# Patient Record
Sex: Male | Born: 1950 | Race: White | Hispanic: No | Marital: Married | Smoking: Former smoker
Health system: Southern US, Community
[De-identification: ages and names within clinical notes are randomized; demographics above are authoritative.]

## PROBLEM LIST (undated history)

## (undated) DIAGNOSIS — Z9889 Other specified postprocedural states: Secondary | ICD-10-CM

## (undated) DIAGNOSIS — E7800 Pure hypercholesterolemia, unspecified: Secondary | ICD-10-CM

## (undated) DIAGNOSIS — K632 Fistula of intestine: Secondary | ICD-10-CM

## (undated) DIAGNOSIS — R011 Cardiac murmur, unspecified: Secondary | ICD-10-CM

## (undated) DIAGNOSIS — K859 Acute pancreatitis without necrosis or infection, unspecified: Secondary | ICD-10-CM

## (undated) DIAGNOSIS — I519 Heart disease, unspecified: Secondary | ICD-10-CM

## (undated) DIAGNOSIS — I10 Essential (primary) hypertension: Secondary | ICD-10-CM

## (undated) DIAGNOSIS — I2510 Atherosclerotic heart disease of native coronary artery without angina pectoris: Secondary | ICD-10-CM

## (undated) DIAGNOSIS — M549 Dorsalgia, unspecified: Secondary | ICD-10-CM

## (undated) DIAGNOSIS — G8929 Other chronic pain: Secondary | ICD-10-CM

## (undated) DIAGNOSIS — F419 Anxiety disorder, unspecified: Secondary | ICD-10-CM

## (undated) DIAGNOSIS — R0602 Shortness of breath: Secondary | ICD-10-CM

## (undated) DIAGNOSIS — K861 Other chronic pancreatitis: Secondary | ICD-10-CM

## (undated) HISTORY — DX: Other specified postprocedural states: Z98.890

## (undated) HISTORY — PX: CHOLECYSTECTOMY: SHX55

## (undated) HISTORY — DX: Essential (primary) hypertension: I10

## (undated) HISTORY — DX: Other chronic pancreatitis: K86.1

## (undated) HISTORY — DX: Anxiety disorder, unspecified: F41.9

## (undated) HISTORY — DX: Pure hypercholesterolemia, unspecified: E78.00

## (undated) HISTORY — PX: TRACHEOSTOMY: SUR1362

## (undated) HISTORY — DX: Cardiac murmur, unspecified: R01.1

## (undated) HISTORY — PX: CHEST TUBE INSERTION: SHX231

## (undated) HISTORY — DX: Other chronic pain: G89.29

## (undated) HISTORY — DX: Fistula of intestine: K63.2

## (undated) HISTORY — DX: Acute pancreatitis without necrosis or infection, unspecified: K85.90

## (undated) HISTORY — DX: Atherosclerotic heart disease of native coronary artery without angina pectoris: I25.10

## (undated) HISTORY — PX: OTHER SURGICAL HISTORY: SHX169

## (undated) HISTORY — DX: Dorsalgia, unspecified: M54.9

## (undated) HISTORY — DX: Heart disease, unspecified: I51.9

---

## 1996-12-28 HISTORY — PX: PTCA: SHX146

## 1999-06-20 ENCOUNTER — Emergency Department (HOSPITAL_COMMUNITY): Admission: EM | Admit: 1999-06-20 | Discharge: 1999-06-20 | Payer: Self-pay | Admitting: Emergency Medicine

## 2001-12-28 HISTORY — PX: CARDIAC CATHETERIZATION: SHX172

## 2002-01-27 ENCOUNTER — Encounter: Payer: Self-pay | Admitting: Cardiovascular Disease

## 2002-01-27 ENCOUNTER — Inpatient Hospital Stay (HOSPITAL_COMMUNITY): Admission: AD | Admit: 2002-01-27 | Discharge: 2002-01-28 | Payer: Self-pay | Admitting: Cardiovascular Disease

## 2005-12-28 DIAGNOSIS — Z9889 Other specified postprocedural states: Secondary | ICD-10-CM

## 2005-12-28 HISTORY — DX: Other specified postprocedural states: Z98.890

## 2009-06-03 ENCOUNTER — Encounter: Admission: RE | Admit: 2009-06-03 | Discharge: 2009-06-03 | Payer: Self-pay | Admitting: Neurosurgery

## 2011-04-30 ENCOUNTER — Other Ambulatory Visit: Payer: Self-pay | Admitting: Neurosurgery

## 2011-04-30 DIAGNOSIS — M545 Low back pain: Secondary | ICD-10-CM

## 2011-05-04 ENCOUNTER — Ambulatory Visit
Admission: RE | Admit: 2011-05-04 | Discharge: 2011-05-04 | Disposition: A | Discharge status: Home / Self Care | Payer: 59 | Source: Ambulatory Visit | Attending: Neurosurgery | Admitting: Neurosurgery

## 2011-05-04 DIAGNOSIS — M545 Low back pain: Secondary | ICD-10-CM

## 2011-05-15 NOTE — Cardiovascular Report (Signed)
[place]  Patient:    Warren Lee, Warren Lee Visit Number: [id number] MRN: [id number]          Service Type: MED Location: 2000 2031 01 Attending Physician:  Ruta Hinds Dictated by:   Lennette Bihari, M.D. Proc. Date: 01/27/02 Admit Date:  01/27/2002 Discharge Date: 01/28/2002   CC:         Dr. Maple Mirza A. Alanda Amass, M.D.   Cardiac Catheterization  INDICATIONS:  Mr. Sickinger is a 60 year old white male with a prior history of known CAD, status post prior PCI to his diagonal vessel approximately five years ago at [facility].  The patient had been experiencing recurrent intermittent episodes of chest pain.  He was seen by his primary care physician, with symptoms suggestive of possible unstable angina.  The patients father is a patient of Dr. Mancel Parsons.  Dr. Alanda Amass was called and he arranged definitive diagnostic catheterization.  DESCRIPTION OF PROCEDURE:  After premedication with Versed 2 mg intravenously, the patient was prepped and draped in the usual fashion.  The right femoral artery was punctured anteriorly and a 6-French sheath was inserted without difficulty.  Diagnostic catheterization was done with 6-French Judkins #4 left and right coronary catheters.  A 6-French pigtail catheter was used for biplane cine left ventriculography, as well as distal aortography.  Hemostasis was obtained by direct manual pressure.  HEMODYNAMIC DATA:  Central aortic pressure was 173/85.  Left ventricular pressure 173/11; post A-wave 27.  ANGIOGRAPHIC DATA:  The left main coronary artery was angiographically normal and bifurcated into an LAD and left circumflex system.  The LAD had 20% very proximal stenosis followed by tandem 30-40% proximal stenoses.  There was no restenosis of the diagonal vessel at prior intervention site.  The circumflex vessel had 20-30% very proximal eccentric smooth narrowing. There was 40% mid AV  groove narrowing.  The right coronary artery was a large vessel that supplied the posterior descending artery.  LEFT VENTRICULOGRAPHY:  Bi-plane cine left ventriculography revealed normal LV function without focal segmental wall motion abnormalities.  DISTAL AORTOGRAPHY:  This showed smooth 20% narrowing in the right renal artery proximally.  The remainder of the aortoiliac system was angiographically normal.  IMPRESSIONS: 1. Normal left ventricular function. 2. Mild coronary obstructive disease with irregularity of the proximal    left anterior descending artery and narrowings of 20%, 30%, and 40%,    without restenosis at a site of prior percutaneous coronary intervention    involving the diagonal vessel; 20-30% proximal narrowing in the left    circumflex coronary artery with 40% mid narrowing.  RECOMMENDATIONS:  Medical therapy.  The patient will require aggressive risk factor modification with smoking cessation, probable initiation of lipid-lowering therapy. Dictated by:   Lennette Bihari, M.D. Attending Physician:  Ruta Hinds DD:  01/27/02 TD:  01/29/02 Job: [id number]

## 2011-05-15 NOTE — Discharge Summary (Signed)
[place]  Patient:    Warren Lee, Warren Lee Visit Number: [id number] MRN: [id number]          Service Type: MED Location: 2000 2031 01 Attending Physician:  Ruta Hinds Dictated by:   [facility] [place], [place]. Admit Date:  01/27/2002 Discharge Date: 01/28/2002   CC:         Dr. Ranae Plumber, [place]   Discharge Summary  ADMISSION DIAGNOSES: 1. Unstable angina. 2. Known history of coronary artery disease with history of percutaneous    angioplasty at [facility] approximately five years ago with    Dr. Adella Hare. 3. Hypertension. 4. Hyperlipidemia. 5. History of cholecystectomy. 6. Hemorrhoids. 7. Family history of cardiovascular disease. 8. Ongoing tobacco use.  DISCHARGE DIAGNOSES:  1. Unstable angina.  2. Known history of coronary artery disease with history of percutaneous     angioplasty at [facility] approximately five years ago with     Dr. Adella Hare.  3. Hypertension.  4. Hyperlipidemia.  5. History of cholecystectomy.  6. Hemorrhoids.  7. Family history of cardiovascular disease.  8. Status post cardiac catheterization on January 27, 2002 by     Dr. Nicki Guadalajara revealing nonobstructive coronary artery disease and     normal left ventricular function.  9. Right renal artery stenosis 20% which was found at cardiac     catheterization on January 27, 2002. 10. Ongoing tobacco use.  HISTORY OF PRESENT ILLNESS:  [name] is a 60 year old white male with a history of CAD status post PCI at [facility] approximately five years ago by Dr. Adella Hare.  He reported to [facility] ER on January 27, 2002 secondary to chest pain.  He stated he had been experiencing chest pain on and off for the last three months.  He had been seen the prior day by his primary care physician at their office secondary to symptoms suspicious of cardiac etiology.  He was experiencing squeezing and pressure pain in the chest which occurred  mostly at rest.  It did have radiation to the left arm.  He denied any associated shortness of breath, nausea or diaphoresis but did report some shortness of breath with exertion.  He had been admitted to [facility] on January 26, 2002 and was then transferred by [facility] to [facility] for further cardiac evaluation and catheterization.  On exam at that time, he was stable with heart rate 55, blood pressure 149/95 and EKG showed normal sinus rhythm with some nondiagnostic T wave inversions in the lateral leads.  No significant abnormalities on physical examination. At that time, he was seen and evaluated by Dr. Nicki Guadalajara who thought that we should proceed with cardiac catheterization.  Risks and benefits were discussed with patient and family and they were agreeable to proceed.  HOSPITAL COURSE:  On January 27, 2002 [name] underwent cardiac catheterization by Dr. Nicki Guadalajara.  He was found to have nonobstructive coronary artery disease and only minimal stenosis in the 20 to 40% range. Normal LV function. 20% right renal artery stenosis with plan to continue medical management of his medical problems.  No complications.  On January 28, 2002 [name] was doing well.  He has had no further chest pain since his admission.  He is afebrile at 96.2, pulse 60, blood pressure 150/80.  Labs are all stable post cardiac catheterization and post catheterization creatinine is at 0.9.  He has maintained normal sinus rhythm with no arrhythmia.  His groin is well healed  and no hematoma and no bleed. All cardiac enzymes are negative.  At this time, he is seen and evaluated by Dr. Delrae Rend who deemed him stable for discharge home.  HOSPITAL CONSULTS:  None.  HOSPITAL PROCEDURES:  Cardiac catheterization on January 27, 2002 by Dr. Nicki Guadalajara.  This revealed normal LV function.  Mild coronary obstructive disease with irregularity of the proximal LAD and narrowing of 20%,  30%, and 40%, without restenosis at a site of prior percutaneous coronary intervention involving the diagonal vessel.  There was 20 to 30% proximal narrowing in the left circumflex coronary artery and 40% mid narrowing.  At that time, Dr. Tresa Endo recommended medical therapy and aggressive risk factor modification with smoking cessation and possible initiation of Lipitor therapy pending lipid profile.  LABORATORY DATA:  Lipid profile shows total cholesterol 159, triglyceride 420, HDL 29, LDL noncalculable.  Cardiac enzymes negative with CKs of 90, 66, and 68.  MBs are 0.5, 0.3, and 0.7.  Troponin I 0.1, 0.3, and 0.3.  Hemoglobin A1C is within normal limits at 6.3.  Liver function tests were all normal.  Metabolic profile:  Sodium 137, potassium 3.8, glucose 131, BUN 12, creatinine 0.9.  On admission PT was 13.0, INR 1.0, PTT 26 all within normal limits.  CBC revealed WBC 9.3, hemoglobin 14.9, hematocrit 42.7, platelets 95.  All labs remained stable throughout the hospitalization and there was no significant variation in any laboratory post procedure.  RADIOLOGY: Chest x-ray on January 27, 2002 shows mild scarring at the right base.  Cardiomegaly.  No definite active process.  DISCHARGE MEDICATIONS: 1. Wellbutrin SR 150 mg take one a day for the first five days and then    increase to taking one tablet twice a day. 2. Enteric coated aspirin 325 mg once a day. 3. Zocor 20 mg one each night. 4. Lopressor 25 mg twice a day. 5. Dyazide 37.5/25 once a day.  Have blood drawn to check a basic metabolic profile in one week (this is because Dyazide was a new medication).  No strenuous activity, lifting greater than five pounds, driving or sexual activity for three days.  Low salt, low fat, low cholesterol diet.  May wash groin with warm water and soap.  Call the office at [phone] for any bleeding or increased pain to the groin. On Monday call [phone] and make an appointment to see Dr.  Tresa Endo in [place]  in two weeks. Follow up with Dr. Elaina Pattee to evaluate the etiology of chest pain now that we know it is not cardiac. Dictated by:   [facility] [place], [place]. Attending Physician:  Ruta Hinds DD:  03/03/02 TD:  03/06/02 Job: [id number]

## 2011-06-28 DIAGNOSIS — Z9889 Other specified postprocedural states: Secondary | ICD-10-CM

## 2011-06-28 HISTORY — DX: Other specified postprocedural states: Z98.890

## 2011-07-20 ENCOUNTER — Ambulatory Visit (INDEPENDENT_AMBULATORY_CARE_PROVIDER_SITE_OTHER): Payer: 59 | Admitting: Gastroenterology

## 2011-07-20 ENCOUNTER — Encounter: Payer: Self-pay | Admitting: Gastroenterology

## 2011-07-20 VITALS — BP 145/75 | HR 76 | Temp 97.2°F | Ht 71.0 in | Wt 179.0 lb

## 2011-07-20 DIAGNOSIS — R109 Unspecified abdominal pain: Secondary | ICD-10-CM

## 2011-07-20 DIAGNOSIS — R1013 Epigastric pain: Secondary | ICD-10-CM

## 2011-07-20 NOTE — Patient Instructions (Signed)
Stop Prilosec. Begin Dexilant 60 mg daily. Samples have been provided. If you notice improvement, contact our office so we may fill the prescription for you.  You have been set up for an upper endoscopy with Dr. Jena Gauss.   Please complete labs. We will contact you with the results.

## 2011-07-20 NOTE — Progress Notes (Signed)
Referring Provider: No ref. provider found Primary Care Physician:  Kirstie Peri, MD Primary Gastroenterologist:  Dr. Jena Gauss  Chief Complaint  Patient presents with  . Diverticulitis    HPI:   [name] is a 60 year old Caucasian male, rather anxious today, who presents at the request of Dr. Sherryll Burger secondary to abdominal pain. He presented to Dr. Sherryll Burger on 7/12 with soreness in lower abdomen, as well as epigastric pain that felt like "a gallbladder attack". Epigastric pain X 6-8 mos.   Lower abdominal pain was described as sore. No fever or chills, no change in bowel habits, no brbpr. BM daily. Last colonoscopy 2 years ago by Dr. Cleotis Nipper. Reports currently not available. Placed on Cipro and Flagyl empirically for presumed diverticulitis. Prilosec also increased to BID at that visit on July 12. Lower abdominal pain resolved.  Epigastric discomfort described as sharp, not exacerbated by eating/drinking. Lasts from 5 to 15 minutes at a time. No N/V, loss of appetite, or wt loss. No NSAIDs. Denies dysphagia. Underwent cholecystectomy 20 years ago, resulting in prolonged hospitalization secondary to what sounds like bile leak. Underwent multiple operations. He has noticed slight improvement with BID Prilosec; however, he continues to take 2-3 OTC antacids on top of this.    Past Medical History  Diagnosis Date  . Hypertension   . Heart disease   . Diabetes mellitus   . Anxiety   . Hypercholesterolemia   . Chronic back pain   . Abdominal wall fistula   . S/P colonoscopy     outside facility, 2 years ago, reports not available    Past Surgical History  Procedure Date  . Cardiac catheterization 2003  . Cholecystectomy     with complications, prolonged hospitalization, with bile leak  . Exploratory laparotomy x 2     after complications with chole  . Tracheostomy   . Chest tube insertion     Current Outpatient Prescriptions  Medication Sig Dispense Refill  . ALPRAZolam (XANAX) 0.25  MG tablet       . amLODipine (NORVASC) 2.5 MG tablet       . aspirin 81 MG tablet Take 81 mg by mouth daily.        Marland Kitchen COREG CR 40 MG 24 hr capsule       . fish oil-omega-3 fatty acids 1000 MG capsule Take 1 g by mouth daily.        Marland Kitchen glimepiride (AMARYL) 2 MG tablet       . losartan (COZAAR) 50 MG tablet       . niacin (NIASPAN) 500 MG CR tablet Take 500 mg by mouth at bedtime.        Marland Kitchen omeprazole (PRILOSEC) 20 MG capsule       . PARoxetine (PAXIL) 20 MG tablet       . pioglitazone-metformin (ACTOPLUS MET) 15-500 MG per tablet Take 1 tablet by mouth 2 (two) times daily with a meal.        . rosuvastatin (CRESTOR) 5 MG tablet Take 5 mg by mouth daily.        . saxagliptin HCl (ONGLYZA) 5 MG TABS tablet Take by mouth daily.            Facility-Administered Medications Ordered in Other Visits  Medication Dose Route Frequency Provider Last Rate Last Dose  . 0.45 % sodium chloride infusion   Intravenous Once Corbin Ade, MD         Allergies as of 07/20/2011  . (No Known Allergies)  Family History  Problem Relation Age of Onset  . Colon cancer Neg Hx     History   Social History  . Marital Status: Married    Spouse Name: N/A    Number of Children: N/A  . Years of Education: N/A   Occupational History  . Not on file.   Social History Main Topics  . Smoking status: Current Everyday Smoker -- 1.5 packs/day    Types: Cigarettes  . Smokeless tobacco: Not on file  . Alcohol Use: No  . Drug Use: No  . Sexually Active: Not on file    Review of Systems: Gen: Denies any fever, chills, loss of appetite, fatigue, weight loss. CV: Denies chest pain, heart palpitations, syncope, peripheral edema. Resp: Denies shortness of breath with rest, cough, wheezing GI: Denies dysphagia or odynophagia. Denies hematemesis, fecal incontinence, or jaundice.  GU : Denies urinary burning, urinary frequency, urinary incontinence.  MS: Denies joint pain, muscle weakness, cramps, limited  movement Derm: Denies rash, itching, dry skin Psych: Denies depression, anxiety, confusion or memory loss  Heme: Denies bruising, bleeding, and enlarged lymph nodes.  Physical Exam: BP 145/75  Pulse 76  Temp(Src) 97.2 F (36.2 C) (Temporal)  Ht 5\' 11"  (1.803 m)  Wt 179 lb (81.194 kg)  BMI 24.97 kg/m2 General:   Alert and oriented. Well-developed, well-nourished, pleasant and cooperative. Somewhat anxious.  Head:  Normocephalic and atraumatic. Eyes:  Conjunctiva pink, sclera clear, no icterus.   Conjunctiva pink. Ears:  Normal auditory acuity. Nose:  No deformity, discharge,  or lesions. Mouth:  No deformity or lesions, mucosa pink and moist.  Neck:  Supple, without mass or thyromegaly. Lungs:  Clear to auscultation bilaterally, mild expiratory wheeze posteriorly Heart:  S1, S2 present without murmurs noted.  Abdomen:  +BS, soft, mildly tender to palpation epigastric region. 3 hernias noted: LLQ, left of umbilicus, RUQ (right of midline). Reducible. Multiple surgical scars noted. non-distended. Without mass or HSM. No rebound or guarding.  Rectal:  Deferred  Msk:  Symmetrical without gross deformities. Normal posture. Extremities:  Without clubbing or edema. Neurologic:  Alert and  oriented x4;  grossly normal neurologically. Skin:  Intact, warm and dry without significant lesions or rashes Cervical Nodes:  No significant cervical adenopathy. Psych:  Alert and cooperative. Normal mood and affect.

## 2011-07-21 ENCOUNTER — Encounter: Payer: Self-pay | Admitting: Gastroenterology

## 2011-07-21 DIAGNOSIS — R1013 Epigastric pain: Secondary | ICD-10-CM | POA: Insufficient documentation

## 2011-07-21 DIAGNOSIS — R109 Unspecified abdominal pain: Secondary | ICD-10-CM | POA: Insufficient documentation

## 2011-07-21 LAB — COMPREHENSIVE METABOLIC PANEL
ALT: 25 U/L (ref 0–53)
AST: 19 U/L (ref 0–37)
Albumin: 3.9 g/dL (ref 3.5–5.2)
CO2: 30 mEq/L (ref 19–32)
Calcium: 9.3 mg/dL (ref 8.4–10.5)
Chloride: 102 mEq/L (ref 96–112)
Creat: 0.87 mg/dL (ref 0.50–1.35)
Potassium: 4.1 mEq/L (ref 3.5–5.3)
Total Protein: 6 g/dL (ref 6.0–8.3)

## 2011-07-21 LAB — CBC WITH DIFFERENTIAL/PLATELET
Basophils Absolute: 0 10*3/uL (ref 0.0–0.1)
Eosinophils Relative: 2 % (ref 0–5)
HCT: 47.2 % (ref 39.0–52.0)
Lymphocytes Relative: 17 % (ref 12–46)
Lymphs Abs: 1.4 10*3/uL (ref 0.7–4.0)
MCV: 96.5 fL (ref 78.0–100.0)
Neutro Abs: 6.1 10*3/uL (ref 1.7–7.7)
Platelets: 153 10*3/uL (ref 150–400)
RBC: 4.89 MIL/uL (ref 4.22–5.81)
WBC: 8.4 10*3/uL (ref 4.0–10.5)

## 2011-07-21 LAB — LIPASE: Lipase: 39 U/L (ref 0–75)

## 2011-07-21 MED ORDER — SODIUM CHLORIDE 0.45 % IV SOLN
Freq: Once | INTRAVENOUS | Status: AC
  Administered 2011-07-22: 11:00:00 via INTRAVENOUS

## 2011-07-21 NOTE — Assessment & Plan Note (Addendum)
60 year old Caucasian male with 6-8 month duration of epigastric pain, described as sharp, not relieved or precipitated by anything. Slight improvement with BID Prilosec (started on July 12) but continues to take 2-3 OTC antacids in addition to this. No nausea, loss of appetite, or wt loss. Denies dysphagia or odynophagia. No reflux. Hx of complicated cholecystectomy in the past. No NSAIDs or aspirin powders. Due to new onset of epigastric pain as well as failure of PPI, will proceed with EGD in the near future to assess for gastritis, PUD, less likely malignancy. Will also obtain baseline labs on pt. Switch PPIs in interim.  ~Proceed with upper endoscopy in the near future with Dr. Jena Gauss. The risks, benefits, and alternatives have been discussed in detail with patient. They have stated understanding and desire to proceed.  ~No oral diabetes meds morning of procedure (spoke with pt personally regarding this) ~Stop Prilosec. Begin Dexilant daily. Samples provided ~CBC, CMP, lipase.

## 2011-07-21 NOTE — Assessment & Plan Note (Signed)
Reports of lower abdominal soreness a few weeks ago, presenting to PCP and treated empirically for diverticulitis. No change in bowel habits, melena, brbpr. Lower abdominal discomfort completely resolved. Has reportedly had recent colonoscopy (2 years ago) at outside facility. Will obtain reports. No need for lower GI tract evaluation currently.

## 2011-07-22 ENCOUNTER — Encounter (HOSPITAL_COMMUNITY)
Admission: RE | Disposition: A | Discharge status: Home / Self Care | Payer: Self-pay | Source: Ambulatory Visit | Attending: Internal Medicine

## 2011-07-22 ENCOUNTER — Encounter (HOSPITAL_COMMUNITY): Payer: Self-pay | Admitting: *Deleted

## 2011-07-22 ENCOUNTER — Encounter: Payer: 59 | Admitting: Internal Medicine

## 2011-07-22 ENCOUNTER — Ambulatory Visit (HOSPITAL_COMMUNITY)
Admission: RE | Admit: 2011-07-22 | Discharge: 2011-07-22 | Disposition: A | Discharge status: Home / Self Care | Payer: 59 | Source: Ambulatory Visit | Attending: Internal Medicine | Admitting: Internal Medicine

## 2011-07-22 DIAGNOSIS — E119 Type 2 diabetes mellitus without complications: Secondary | ICD-10-CM | POA: Insufficient documentation

## 2011-07-22 DIAGNOSIS — R109 Unspecified abdominal pain: Secondary | ICD-10-CM | POA: Insufficient documentation

## 2011-07-22 DIAGNOSIS — Z7982 Long term (current) use of aspirin: Secondary | ICD-10-CM | POA: Insufficient documentation

## 2011-07-22 DIAGNOSIS — K449 Diaphragmatic hernia without obstruction or gangrene: Secondary | ICD-10-CM

## 2011-07-22 DIAGNOSIS — Z79899 Other long term (current) drug therapy: Secondary | ICD-10-CM | POA: Insufficient documentation

## 2011-07-22 DIAGNOSIS — I10 Essential (primary) hypertension: Secondary | ICD-10-CM | POA: Insufficient documentation

## 2011-07-22 DIAGNOSIS — Z01812 Encounter for preprocedural laboratory examination: Secondary | ICD-10-CM | POA: Insufficient documentation

## 2011-07-22 HISTORY — PX: ESOPHAGOGASTRODUODENOSCOPY: SHX5428

## 2011-07-22 HISTORY — DX: Shortness of breath: R06.02

## 2011-07-22 SURGERY — EGD (ESOPHAGOGASTRODUODENOSCOPY)
Anesthesia: Moderate Sedation

## 2011-07-22 MED ORDER — MIDAZOLAM HCL 5 MG/5ML IJ SOLN
INTRAMUSCULAR | Status: AC
  Filled 2011-07-22: qty 10

## 2011-07-22 MED ORDER — BUTAMBEN-TETRACAINE-BENZOCAINE 2-2-14 % EX AERO
INHALATION_SPRAY | CUTANEOUS | Status: DC | PRN
  Administered 2011-07-22: 2 via TOPICAL

## 2011-07-22 MED ORDER — MEPERIDINE HCL 100 MG/ML IJ SOLN
INTRAMUSCULAR | Status: DC | PRN
  Administered 2011-07-22: 50 mg via INTRAVENOUS

## 2011-07-22 MED ORDER — MIDAZOLAM HCL 5 MG/5ML IJ SOLN
INTRAMUSCULAR | Status: DC | PRN
  Administered 2011-07-22: 1 mg via INTRAVENOUS
  Administered 2011-07-22: 2 mg via INTRAVENOUS

## 2011-07-22 MED ORDER — MEPERIDINE HCL 100 MG/ML IJ SOLN
INTRAMUSCULAR | Status: AC
  Filled 2011-07-22: qty 2

## 2011-07-22 NOTE — Progress Notes (Signed)
Cc to PCP 

## 2011-07-22 NOTE — H&P (Signed)
Gerrit Halls, NP  07/21/2011  5:28 PM  Signed   Referring Provider: No ref. provider found Primary Care Physician:  Kirstie Peri, MD Primary Gastroenterologist:  Dr. Jena Gauss    Chief Complaint   Patient presents with   .  Diverticulitis      HPI:    Mr. Meddings is a 60 year old Caucasian male, rather anxious today, who presents at the request of Dr. Sherryll Burger secondary to abdominal pain. He presented to Dr. Sherryll Burger on 7/12 with soreness in lower abdomen, as well as epigastric pain that felt like "a gallbladder attack". Epigastric pain X 6-8 mos.    Lower abdominal pain was described as sore. No fever or chills, no change in bowel habits, no brbpr. BM daily. Last colonoscopy 2 years ago by Dr. Cleotis Nipper. Reports currently not available. Placed on Cipro and Flagyl empirically for presumed diverticulitis. Prilosec also increased to BID at that visit on July 12. Lower abdominal pain resolved.   Epigastric discomfort described as sharp, not exacerbated by eating/drinking. Lasts from 5 to 15 minutes at a time. No N/V, loss of appetite, or wt loss. No NSAIDs. Denies dysphagia. Underwent cholecystectomy 20 years ago, resulting in prolonged hospitalization secondary to what sounds like bile leak. Underwent multiple operations. He has noticed slight improvement with BID Prilosec; however, he continues to take 2-3 OTC antacids on top of this.       Past Medical History   Diagnosis  Date   .  Hypertension     .  Heart disease     .  Diabetes mellitus     .  Anxiety     .  Hypercholesterolemia     .  Chronic back pain     .  Abdominal wall fistula     .  S/P colonoscopy         outside facility, 2 years ago, reports not available       Past Surgical History   Procedure  Date   .  Cardiac catheterization  2003   .  Cholecystectomy         with complications, prolonged hospitalization, with bile leak   .  Exploratory laparotomy x 2         after complications with chole   .  Tracheostomy     .   Chest tube insertion         Current Outpatient Prescriptions   Medication  Sig  Dispense  Refill   .  ALPRAZolam (XANAX) 0.25 MG tablet           .  amLODipine (NORVASC) 2.5 MG tablet           .  aspirin 81 MG tablet  Take 81 mg by mouth daily.           Marland Kitchen  COREG CR 40 MG 24 hr capsule           .  fish oil-omega-3 fatty acids 1000 MG capsule  Take 1 g by mouth daily.           Marland Kitchen  glimepiride (AMARYL) 2 MG tablet           .  losartan (COZAAR) 50 MG tablet           .  niacin (NIASPAN) 500 MG CR tablet  Take 500 mg by mouth at bedtime.           Marland Kitchen  omeprazole (PRILOSEC) 20 MG capsule           .  PARoxetine (PAXIL) 20 MG tablet           .  pioglitazone-metformin (ACTOPLUS MET) 15-500 MG per tablet  Take 1 tablet by mouth 2 (two) times daily with a meal.           .  rosuvastatin (CRESTOR) 5 MG tablet  Take 5 mg by mouth daily.           .  saxagliptin HCl (ONGLYZA) 5 MG TABS tablet  Take by mouth daily.                      Facility-Administered Medications Ordered in Other Visits   Medication  Dose  Route  Frequency  Provider  Last Rate  Last Dose   .  0.45 % sodium chloride infusion     Intravenous  Once  Corbin Ade, MD                 Allergies as of 07/20/2011   .  (No Known Allergies)       Family History   Problem  Relation  Age of Onset   .  Colon cancer  Neg Hx         History       Social History   .  Marital Status:  Married       Spouse Name:  N/A       Number of Children:  N/A   .  Years of Education:  N/A       Occupational History   .  Not on file.       Social History Main Topics   .  Smoking status:  Current Everyday Smoker -- 1.5 packs/day       Types:  Cigarettes   .  Smokeless tobacco:  Not on file   .  Alcohol Use:  No   .  Drug Use:  No   .  Sexually Active:  Not on file        Review of Systems: Gen: Denies any fever, chills, loss of appetite, fatigue, weight loss. CV: Denies chest pain, heart palpitations, syncope,  peripheral edema. Resp: Denies shortness of breath with rest, cough, wheezing GI: Denies dysphagia or odynophagia. Denies hematemesis, fecal incontinence, or jaundice.   GU : Denies urinary burning, urinary frequency, urinary incontinence.   MS: Denies joint pain, muscle weakness, cramps, limited movement Derm: Denies rash, itching, dry skin Psych: Denies depression, anxiety, confusion or memory loss   Heme: Denies bruising, bleeding, and enlarged lymph nodes.   Physical Exam: BP 145/75  Pulse 76  Temp(Src) 97.2 F (36.2 C) (Temporal)  Ht 5\' 11"  (1.803 m)  Wt 179 lb (81.194 kg)  BMI 24.97 kg/m2 General:   Alert and oriented. Well-developed, well-nourished, pleasant and cooperative. Somewhat anxious.   Head:  Normocephalic and atraumatic. Eyes:  Conjunctiva pink, sclera clear, no icterus.   Conjunctiva pink. Ears:  Normal auditory acuity. Nose:  No deformity, discharge,  or lesions. Mouth:  No deformity or lesions, mucosa pink and moist.   Neck:  Supple, without mass or thyromegaly. Lungs:  Clear to auscultation bilaterally, mild expiratory wheeze posteriorly Heart:  S1, S2 present without murmurs noted.   Abdomen:  +BS, soft, mildly tender to palpation epigastric region. 3 hernias noted: LLQ, left of umbilicus, RUQ (right of midline). Reducible. Multiple surgical scars noted. non-distended. Without mass or HSM. No rebound or guarding.   Rectal:  Deferred   Msk:  Symmetrical without  gross deformities. Normal posture. Extremities:  Without clubbing or edema. Neurologic:  Alert and  oriented x4;  grossly normal neurologically. Skin:  Intact, warm and dry without significant lesions or rashes Cervical Nodes:  No significant cervical adenopathy. Psych:  Alert and cooperative. Normal mood and affect.       Glendora Score  07/22/2011  9:30 AM  Signed Cc to PCP        Epigastric pain - Gerrit Halls, NP  07/21/2011  5:28 PM  Addendum 60 year old Caucasian male with 6-8 month duration of  epigastric pain, described as sharp, not relieved or precipitated by anything. Slight improvement with BID Prilosec (started on July 12) but continues to take 2-3 OTC antacids in addition to this. No nausea, loss of appetite, or wt loss. Denies dysphagia or odynophagia. No reflux. Hx of complicated cholecystectomy in the past. No NSAIDs or aspirin powders. Due to new onset of epigastric pain as well as failure of PPI, will proceed with EGD in the near future to assess for gastritis, PUD, less likely malignancy. Will also obtain baseline labs on pt. Switch PPIs in interim.   ~Proceed with upper endoscopy in the near future with Dr. Jena Gauss. The risks, benefits, and alternatives have been discussed in detail with patient. They have stated understanding and desire to proceed.   ~No oral diabetes meds morning of procedure (spoke with pt personally regarding this) ~Stop Prilosec. Begin Dexilant daily. Samples provided ~CBC, CMP, lipase.      Previous Version  Abdominal  pain, other specified site - Gerrit Halls, NP  07/21/2011  5:28 PM  Signed Reports of lower abdominal soreness a few weeks ago, presenting to PCP and treated empirically for diverticulitis. No change in bowel habits, melena, brbpr. Lower abdominal discomfort completely resolved. Has reportedly had recent colonoscopy (2 years ago) at outside facility. Will obtain reports. No need for lower GI tract evaluation currently.     I have seen the patient prior to the procedure(s) today and reviewed the history and physical / consultation from 07/21/11.  There have been no changes. After consideration of the risks, benefits, alternatives and imponderables, the patient has consented to the procedure(s).

## 2011-07-27 ENCOUNTER — Ambulatory Visit (HOSPITAL_COMMUNITY)
Admit: 2011-07-27 | Discharge: 2011-07-27 | Disposition: A | Discharge status: Home / Self Care | Payer: 59 | Source: Ambulatory Visit | Attending: Internal Medicine | Admitting: Internal Medicine

## 2011-07-27 DIAGNOSIS — K5730 Diverticulosis of large intestine without perforation or abscess without bleeding: Secondary | ICD-10-CM | POA: Insufficient documentation

## 2011-07-27 DIAGNOSIS — D3500 Benign neoplasm of unspecified adrenal gland: Secondary | ICD-10-CM | POA: Insufficient documentation

## 2011-07-27 DIAGNOSIS — K439 Ventral hernia without obstruction or gangrene: Secondary | ICD-10-CM | POA: Insufficient documentation

## 2011-07-27 DIAGNOSIS — N400 Enlarged prostate without lower urinary tract symptoms: Secondary | ICD-10-CM | POA: Insufficient documentation

## 2011-07-27 DIAGNOSIS — R1013 Epigastric pain: Secondary | ICD-10-CM | POA: Insufficient documentation

## 2011-07-27 DIAGNOSIS — K869 Disease of pancreas, unspecified: Secondary | ICD-10-CM | POA: Insufficient documentation

## 2011-07-27 DIAGNOSIS — R1031 Right lower quadrant pain: Secondary | ICD-10-CM | POA: Insufficient documentation

## 2011-07-27 MED ORDER — IOHEXOL 300 MG/ML  SOLN
100.0000 mL | Freq: Once | INTRAMUSCULAR | Status: AC | PRN
  Administered 2011-07-27: 100 mL via INTRAVENOUS

## 2011-07-28 ENCOUNTER — Encounter (HOSPITAL_COMMUNITY): Payer: Self-pay | Admitting: Internal Medicine

## 2011-07-28 ENCOUNTER — Telehealth: Payer: Self-pay | Admitting: Gastroenterology

## 2011-07-28 ENCOUNTER — Other Ambulatory Visit: Payer: Self-pay | Admitting: Gastroenterology

## 2011-07-28 DIAGNOSIS — K859 Acute pancreatitis without necrosis or infection, unspecified: Secondary | ICD-10-CM

## 2011-07-28 NOTE — Telephone Encounter (Signed)
Pt aware, he asked for information sent to his personal fax. Sent info. Lab order faxed to lab. Samples at front desk.  Pt on dexilant and requesting more samples. Left #3 boxes at front desk with Creon. Pt will await further recommendations from [facility].

## 2011-07-28 NOTE — Telephone Encounter (Signed)
Chronic pancreatitis on CT.  Let's get lipid panel to have on file. Low-fat diet to pt Start pancreatic enzymes. Creon 24,000 units. Take 2 with each meal, 1 with snacks. May provide samples.  Make sure on a PPI (was given Dexilant at [facility]) Further recommendations per Dr. Jena Gauss. Anticipate possible EUS in the future.

## 2011-07-30 ENCOUNTER — Other Ambulatory Visit: Payer: Self-pay | Admitting: Gastroenterology

## 2011-07-30 NOTE — Telephone Encounter (Signed)
Pt also wants to know if he needs an Rx for creon.

## 2011-07-31 LAB — LIPID PANEL
LDL Cholesterol: 109 mg/dL — ABNORMAL HIGH (ref 0–99)
Total CHOL/HDL Ratio: 5 Ratio
VLDL: 26 mg/dL (ref 0–40)

## 2011-08-03 ENCOUNTER — Telehealth: Payer: Self-pay

## 2011-08-03 NOTE — Telephone Encounter (Signed)
Pt requesting rx for Creon sent to [facility]

## 2011-08-03 NOTE — Telephone Encounter (Signed)
Chart reviewed from last 2 weeks; trial of pancreatic enzymes reasonable; may or may not need an EUs; lets plan to see him back in the office in near future and re-assess

## 2011-08-04 ENCOUNTER — Telehealth: Payer: Self-pay | Admitting: General Practice

## 2011-08-04 ENCOUNTER — Encounter: Payer: Self-pay | Admitting: Gastroenterology

## 2011-08-04 MED ORDER — DEXLANSOPRAZOLE 60 MG PO CPDR: 60.0000 mg | DELAYED_RELEASE_CAPSULE | Freq: Every day | ORAL | Status: AC

## 2011-08-04 MED ORDER — PANCRELIPASE (LIP-PROT-AMYL) 24000-76000 UNITS PO CPEP: ORAL_CAPSULE | ORAL | Status: DC

## 2011-08-04 NOTE — Telephone Encounter (Signed)
Please set up for appt. Thanks

## 2011-08-04 NOTE — Progress Notes (Signed)
  Pt requesting Dexilant rx. Will send to pharmacy.

## 2011-08-04 NOTE — Telephone Encounter (Signed)
Pt needs to have prior authorization for creon [id number] option 2,3 (90day supply).

## 2011-08-05 ENCOUNTER — Other Ambulatory Visit: Payer: Self-pay | Admitting: Gastroenterology

## 2011-08-05 DIAGNOSIS — R1013 Epigastric pain: Secondary | ICD-10-CM

## 2011-08-05 MED ORDER — PANCRELIPASE (LIP-PROT-AMYL) 24000-76000 UNITS PO CPEP: ORAL_CAPSULE | ORAL | Status: DC

## 2011-08-05 NOTE — Telephone Encounter (Signed)
rx faxed for Creon, PA started for Dexilant.

## 2011-08-17 ENCOUNTER — Ambulatory Visit (INDEPENDENT_AMBULATORY_CARE_PROVIDER_SITE_OTHER): Payer: 59 | Admitting: Gastroenterology

## 2011-08-17 ENCOUNTER — Encounter: Payer: Self-pay | Admitting: Gastroenterology

## 2011-08-17 VITALS — BP 119/69 | HR 74 | Temp 97.3°F | Ht 71.0 in | Wt 177.6 lb

## 2011-08-17 DIAGNOSIS — R1013 Epigastric pain: Secondary | ICD-10-CM

## 2011-08-17 NOTE — Patient Instructions (Signed)
Please follow the low-fat diet.  Stop Dexilant for now. We are going to try Aciphex once daily. Take it at the same time every day.  Continue the pancreas enzymes.  Further recommendations to follow.  We will see you back after the ultrasound is completed.

## 2011-08-17 NOTE — Progress Notes (Signed)
Referring Provider: Kirstie Peri, MD Primary Care Physician:  Kirstie Peri, MD Primary Gastroenterologist: Dr. Jena Gauss   Chief Complaint  Patient presents with  . Follow-up    test results    HPI:   Warren Lee returns today in follow-up after EGD by Dr. Jena Gauss in July 2012. This was normal, only small hiatal hernia. We proceeded with a CT scan to evaluate other etiology to abdominal pain. CT showed chronic calcific pancreatitis without evidence of acute pancreatitis. Labs have all essentially been normal. We started him on Creon and asked him to follow-up here today for progress report. He reports intermittent epigastric pain, almost daily. Described as a "dull" pain. Feels like if he could burp, he would get relief. He woke up a few nights ago with an "uneasiness" in epigastric area. He feels sometimes like his pills get "stuck" in this area when swallowing. He has taken antacids prn. He ate spaghetti prior to the evening of discomfort.  His wife is present with him today. He is quite anxious, and they are concerned regarding the etiology of this chronic pancreatitis. He has issues with chronic back pain, and he reports that he is in pain "all the time".  Denies diarrhea. No melena or brbpr.     Past Medical History  Diagnosis Date  . Hypertension   . Heart disease   . Diabetes mellitus   . Anxiety   . Hypercholesterolemia   . Chronic back pain   . Abdominal wall fistula   . S/P colonoscopy 2007    Dr. Cleotis Nipper: normal   . Shortness of breath   . S/P endoscopy July 2012    normal  . Chronic pancreatitis     CT July 2012: Chronic calcific pancreatitis without evidence of acute    Past Surgical History  Procedure Date  . Cardiac catheterization 2003  . Cholecystectomy     with complications, prolonged hospitalization, with bile leak  . Exploratory laparotomy x 2     after complications with chole  . Tracheostomy   . Chest tube insertion   . Esophagogastroduodenoscopy  07/22/2011    Procedure: ESOPHAGOGASTRODUODENOSCOPY (EGD);  Surgeon: Corbin Ade, MD;  Location: [facility];  Service: Endoscopy;  Laterality: N/A;    Current Outpatient Prescriptions  Medication Sig Dispense Refill  . ALPRAZolam (XANAX) 0.25 MG tablet       . amLODipine (NORVASC) 2.5 MG tablet       . aspirin 81 MG tablet Take 81 mg by mouth daily.        Marland Kitchen COREG CR 40 MG 24 hr capsule       . dexlansoprazole (DEXILANT) 60 MG capsule Take 1 capsule (60 mg total) by mouth daily.  31 capsule  11  . fish oil-omega-3 fatty acids 1000 MG capsule Take 1 g by mouth daily.        Marland Kitchen glimepiride (AMARYL) 2 MG tablet       . losartan (COZAAR) 50 MG tablet       . niacin (NIASPAN) 500 MG CR tablet Take 500 mg by mouth at bedtime.        Marland Kitchen omeprazole (PRILOSEC) 20 MG capsule       . Pancrelipase, Lip-Prot-Amyl, (CREON) 24000 UNITS CPEP Take 2 caps before breakfast, lunch, and dinner. 1 with snacks.  720 capsule  3  . PARoxetine (PAXIL) 20 MG tablet       . pioglitazone-metformin (ACTOPLUS MET) 15-500 MG per tablet Take 1 tablet by mouth 2 (two)  times daily with a meal.        . rosuvastatin (CRESTOR) 5 MG tablet Take 5 mg by mouth daily.        . saxagliptin HCl (ONGLYZA) 5 MG TABS tablet Take by mouth daily.          Allergies as of 08/17/2011  . (No Known Allergies)    Family History  Problem Relation Age of Onset  . Colon cancer Neg Hx     History   Social History  . Marital Status: Married    Spouse Name: N/A    Number of Children: N/A  . Years of Education: N/A   Social History Main Topics  . Smoking status: Current Everyday Smoker -- 1.5 packs/day for 40 years    Types: Cigarettes  . Smokeless tobacco: None  . Alcohol Use: No  . Drug Use: No  . Sexually Active: None    Review of Systems: Gen: Denies fever, chills, anorexia. Denies fatigue, weakness, weight loss.  CV: Denies chest pain, palpitations, syncope, peripheral edema, and claudication. Resp: Denies dyspnea  at rest, cough, wheezing, coughing up blood, and pleurisy. GI: Denies vomiting blood, jaundice, and fecal incontinence.   Denies dysphagia or odynophagia. Derm: Denies rash, itching, dry skin Psych: Denies depression, anxiety, memory loss, confusion. No homicidal or suicidal ideation.  Heme: Denies bruising, bleeding, and enlarged lymph nodes.  Physical Exam: BP 119/69  Pulse 74  Temp(Src) 97.3 F (36.3 C) (Temporal)  Ht 5\' 11"  (1.803 m)  Wt 177 lb 9.6 oz (80.559 kg)  BMI 24.77 kg/m2 General:   Alert and oriented. Slightly anxious, otherwise pleasant.  Head:  Normocephalic and atraumatic. Eyes:  Conjuctiva clear without scleral icterus. Mouth:  Oral mucosa pink and moist. Good dentition. No lesions. Neck:  Supple, without mass or thyromegaly. Heart:  S1, S2 present without murmurs, rubs, or gallops. Regular rate and rhythm. Abdomen:  +BS, multiple incisional scars from prior operation. 3 ventral hernias noted: LLQ, left of umbilicus, RUQ (right of midline). Non-distended. Mildly tender to palpation epigastric region.  Msk:  Symmetrical without gross deformities. Normal posture. Extremities:  Without edema. Neurologic:  Alert and  oriented x4;  grossly normal neurologically. Skin:  Intact without significant lesions or rashes. Cervical Nodes:  No significant cervical adenopathy. Psych:  Alert and cooperative. Normal mood and affect.

## 2011-08-18 ENCOUNTER — Encounter: Payer: Self-pay | Admitting: Gastroenterology

## 2011-08-18 NOTE — Assessment & Plan Note (Signed)
60 year old Caucasian male with recent EGD normal, CT with findings of chronic calcific pancreatitis. Denies prior issues with pancreatitis. Epigastric pain likely r/t this finding. Question component of dietary factors; pt reports worsening of pain with things such as spaghetti. Quite anxious at time of visit. Labs essentially normal.  Not mentioned above, only hx of ETOH use was as young man. Does report large amounts of drinking at that time. On Creon and PPI. Due to continued pain, concern for any underlying etiology, we will proceed with EUS to r/u underlying process.   Continue Creon  Trial of Aciphex EUS LOW-FAT DIET F/U after EUS completed.

## 2011-08-19 ENCOUNTER — Telehealth: Payer: Self-pay

## 2011-08-19 DIAGNOSIS — K861 Other chronic pancreatitis: Secondary | ICD-10-CM

## 2011-08-19 NOTE — Telephone Encounter (Signed)
Pt has been instructed and meds reviewed he will call with any further questions

## 2011-08-19 NOTE — Progress Notes (Signed)
Cc to PCP 

## 2011-08-19 NOTE — Telephone Encounter (Signed)
Message copied by Donata Duff on Wed Aug 19, 2011  9:50 AM ------      Message from: Rachael Fee      Created: Tue Aug 18, 2011  9:43 AM       Crystal,       We will set this up.            Elizet Kaplan,      Can you put him in for upper EUS, radial +/- linear, 60 min, diagnosis abnormal pancreas (chronic pancreatitis). +propofol, next available ?EUS Thursday                  Thanks            dj                        ----- Message -----         From: Chales Abrahams, CMA         Sent: 08/18/2011   8:49 AM           To: Rob Bunting, MD                        ----- Message -----         From: Avie Arenas         Sent: 08/18/2011   8:24 AM           To: Chales Abrahams, CMA            Good Morning,            This patient needs an EUS with Dr Christella Hartigan for chronic pancreatitis.                  Thank you for your help,            Cherene Julian      Pt referral Coordinator       [facility]

## 2011-08-19 NOTE — Telephone Encounter (Signed)
Pt needs to be instructed and meds reviewed.  Left message on machine to call back

## 2011-09-02 NOTE — Telephone Encounter (Signed)
Pt had OV on 08/17/11 at 330 with AS

## 2011-10-08 ENCOUNTER — Encounter: Payer: 59 | Admitting: Gastroenterology

## 2011-10-08 ENCOUNTER — Telehealth: Payer: Self-pay | Admitting: Gastroenterology

## 2011-10-08 ENCOUNTER — Ambulatory Visit (HOSPITAL_COMMUNITY)
Admission: RE | Admit: 2011-10-08 | Discharge: 2011-10-08 | Disposition: A | Discharge status: Home / Self Care | Payer: 59 | Source: Ambulatory Visit | Attending: Gastroenterology | Admitting: Gastroenterology

## 2011-10-08 DIAGNOSIS — Z79899 Other long term (current) drug therapy: Secondary | ICD-10-CM | POA: Insufficient documentation

## 2011-10-08 DIAGNOSIS — Z7982 Long term (current) use of aspirin: Secondary | ICD-10-CM | POA: Insufficient documentation

## 2011-10-08 DIAGNOSIS — K861 Other chronic pancreatitis: Secondary | ICD-10-CM | POA: Insufficient documentation

## 2011-10-08 DIAGNOSIS — E7800 Pure hypercholesterolemia, unspecified: Secondary | ICD-10-CM | POA: Insufficient documentation

## 2011-10-08 DIAGNOSIS — I10 Essential (primary) hypertension: Secondary | ICD-10-CM | POA: Insufficient documentation

## 2011-10-08 DIAGNOSIS — J449 Chronic obstructive pulmonary disease, unspecified: Secondary | ICD-10-CM | POA: Insufficient documentation

## 2011-10-08 DIAGNOSIS — J4489 Other specified chronic obstructive pulmonary disease: Secondary | ICD-10-CM | POA: Insufficient documentation

## 2011-10-08 DIAGNOSIS — R933 Abnormal findings on diagnostic imaging of other parts of digestive tract: Secondary | ICD-10-CM

## 2011-10-08 DIAGNOSIS — G4730 Sleep apnea, unspecified: Secondary | ICD-10-CM | POA: Insufficient documentation

## 2011-10-08 DIAGNOSIS — E119 Type 2 diabetes mellitus without complications: Secondary | ICD-10-CM | POA: Insufficient documentation

## 2011-10-08 LAB — GLUCOSE, CAPILLARY: Glucose-Capillary: 160 mg/dL — ABNORMAL HIGH (ref 70–99)

## 2011-10-08 NOTE — Telephone Encounter (Signed)
His wife had called earlier. EMS evaluation advised. The patient had an endoscopic ultrasound today with propofol sedation. Subsequently he developed some facial flushing, conjunctival injection and has had some hemoptysis with streaks or chunks of blood mixed in with the other sputum. She reports he had sinus problems ongoing for weeks so he had not been coughing up any blood or having any bloody drainage. The EMS crew was there at the house at this time his blood pressure is apparently elevated. The EMS crew was not certain he really needed an annual and thrive to the emergency department. I advised that she take him or use the annulus to go to the emergency department and be evaluated tonight, I suspect this is perhaps a sedation related issue what the reaction to the propofol or other medications that might have been used her says some sort of exacerbation of a sinusitis issue that had been ongoing but told her we could not be certain and that it urgent care or emergency room evaluation was appropriate.

## 2011-10-08 NOTE — Telephone Encounter (Signed)
Pt wife was advised to call an ambulance and have her husband evaluated.  She says that he is coughing up chunks of blood and has what looks like blood coming to the surface of  his face. She agreed.

## 2011-10-09 NOTE — Telephone Encounter (Signed)
Yes, he can take a trip in the car

## 2011-10-09 NOTE — Telephone Encounter (Signed)
Patty, can you call him this am.  See how he is feeling

## 2011-10-09 NOTE — Telephone Encounter (Signed)
Pt aware will call with any further problems of concerns

## 2011-10-09 NOTE — Telephone Encounter (Signed)
Pt says that his throat is still sore and his eyes are still blood shot, maybe some better.  ER evaluation prescribed abx for sinusitis.  Pt was advised that he can take tylenol for headache at recommended doses.  Pt wife would like to know if the pt can take a trip today he will not be driving just riding.

## 2011-11-02 ENCOUNTER — Telehealth: Payer: Self-pay | Admitting: Gastroenterology

## 2011-11-02 NOTE — Telephone Encounter (Signed)
Pt is seeing his PCP today but wanted to know if the EUS he had in oct could have caused his red swollen eyes.  I advised him to discuss with his PCP and call back if he still feels like he needs to talk to Dr Christella Hartigan

## 2012-01-25 ENCOUNTER — Telehealth: Payer: Self-pay

## 2012-01-25 NOTE — Telephone Encounter (Signed)
Results faxed.

## 2012-01-25 NOTE — Telephone Encounter (Signed)
Pt called- he is having problems with his short term disability. Pt wants to know if he can have a copy of his procedure that he had done with Dr. Christella Hartigan sent to his fax number- [phone]

## 2012-02-01 ENCOUNTER — Telehealth: Payer: Self-pay

## 2012-02-01 NOTE — Telephone Encounter (Signed)
Pt called needing to get a letter with the dates of his procedure on it with Dr. Jena Gauss and the one in [place], also the office visit with Gerrit Halls in Aug. 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

## 2012-02-04 ENCOUNTER — Encounter: Payer: Self-pay | Admitting: Gastroenterology

## 2012-02-04 NOTE — Telephone Encounter (Signed)
Noted. Will draft letter and cc to you. Thanks.

## 2012-05-19 ENCOUNTER — Telehealth: Payer: Self-pay | Admitting: Internal Medicine

## 2012-05-19 NOTE — Telephone Encounter (Signed)
Patient has been turned over to collections for a $40.00 bill and he hasnt been seen since 8/12 and he says he never received a bill from [place] in the beginning please return patients call.

## 2012-05-19 NOTE — Telephone Encounter (Signed)
I called the patient to follow-up on his complain about his bill, Warren Lee for patient.  I explained that I will be on vacation until, Tuesday, May 24, 2012

## 2012-05-24 NOTE — Telephone Encounter (Signed)
I spoke with Mr' Buttler's wife and she stated that her husband might have misplaced the bill.  I explained to her this was a copay from [facility] 08/17/2011.  She asked me to send her a new statement and she will take care of it.  I have reached out to [employer] to resend another invoice.

## 2013-06-09 DIAGNOSIS — R42 Dizziness and giddiness: Secondary | ICD-10-CM

## 2013-06-19 ENCOUNTER — Ambulatory Visit: Payer: 59 | Admitting: Cardiovascular Disease

## 2013-09-08 ENCOUNTER — Encounter: Payer: Self-pay | Admitting: Cardiovascular Disease

## 2013-09-08 ENCOUNTER — Ambulatory Visit (INDEPENDENT_AMBULATORY_CARE_PROVIDER_SITE_OTHER): Payer: BC Managed Care – PPO | Admitting: Cardiovascular Disease

## 2013-09-08 VITALS — BP 130/60 | HR 94 | Ht 71.0 in | Wt 200.4 lb

## 2013-09-08 DIAGNOSIS — I2510 Atherosclerotic heart disease of native coronary artery without angina pectoris: Secondary | ICD-10-CM | POA: Insufficient documentation

## 2013-09-08 DIAGNOSIS — I119 Hypertensive heart disease without heart failure: Secondary | ICD-10-CM

## 2013-09-08 DIAGNOSIS — K219 Gastro-esophageal reflux disease without esophagitis: Secondary | ICD-10-CM

## 2013-09-08 DIAGNOSIS — E119 Type 2 diabetes mellitus without complications: Secondary | ICD-10-CM

## 2013-09-08 DIAGNOSIS — E785 Hyperlipidemia, unspecified: Secondary | ICD-10-CM | POA: Insufficient documentation

## 2013-09-08 MED ORDER — LOSARTAN POTASSIUM-HCTZ 100-12.5 MG PO TABS: 1.0000 | ORAL_TABLET | Freq: Every day | ORAL | Status: DC

## 2013-09-08 MED ORDER — CARVEDILOL 6.25 MG PO TABS: 6.2500 mg | ORAL_TABLET | Freq: Two times a day (BID) | ORAL | Status: DC

## 2013-09-08 NOTE — Progress Notes (Signed)
Patient ID: Warren Lee, male   DOB: Jun 26, 1951, 62 y.o.   MRN: [id number]     HPI: Warren Lee, is a 61 y.o. male presents to the office today for cardiology evaluation. I have not seen him in over 2 years.  Warren Lee has established coronary artery disease in 1998 underwent PTCA of his diagonal vessel. His last cardiac catheterization was in January 2003 which did not show restenosis but he did have 20-30% proximal eccentric circumflex stenosis as well as 40% mid AV groove circumflex stenosis. The patient has a history of hypertension, type 2 diabetes mellitus, mixed hyperlipidemia, as well as tobacco use. He also has had an episode of pancreatitis. He had seen Dr. Lucianne Muss for his diabetes mellitus.  Recently, he states he has been gaining fluid. He notes swelling of his ankles and leg. He did see his primary physician Dr. Clelia Croft in [place]. He presents for evaluation. He does admit to some weight gain. He does note some mild shortness of breath. Apparently he was referred for an echo Doppler study which was done in year and interpreted by Dr. Oswaldo Milian. This revealed normal systolic function with mild LVH and evidence for probable diastolic dysfunction. He did have mild/moderate mitral regurgitation.  Past Medical History  Diagnosis Date  . Hypertension   . Heart disease   . Diabetes mellitus   . Anxiety   . Hypercholesterolemia   . Chronic back pain   . Abdominal wall fistula   . S/P colonoscopy 2007    Dr. Cleotis Nipper: normal   . Shortness of breath   . S/P endoscopy July 2012    normal  . Chronic pancreatitis     CT July 2012: Chronic calcific pancreatitis without evidence of acute    Past Surgical History  Procedure Laterality Date  . Cardiac catheterization  2003  . Cholecystectomy      with complications, prolonged hospitalization, with bile leak  . Exploratory laparotomy x 2      after complications with chole  . Tracheostomy    . Chest tube insertion    .  Esophagogastroduodenoscopy  07/22/2011    Procedure: ESOPHAGOGASTRODUODENOSCOPY (EGD);  Surgeon: Corbin Ade, MD;  Location: [facility];  Service: Endoscopy;  Laterality: N/A;    No Known Allergies  Current Outpatient Prescriptions  Medication Sig Dispense Refill  . ALPRAZolam (XANAX) 0.25 MG tablet Take 0.25 mg by mouth daily.       Marland Kitchen aspirin 81 MG tablet Take 81 mg by mouth daily.        . fish oil-omega-3 fatty acids 1000 MG capsule Take 1 g by mouth daily.        Marland Kitchen glimepiride (AMARYL) 2 MG tablet Take 2 mg by mouth daily.       . mometasone-formoterol (DULERA) 200-5 MCG/ACT AERO Inhale 2 puffs into the lungs as needed for wheezing.      Marland Kitchen PARoxetine (PAXIL) 20 MG tablet Take 20 mg by mouth daily.       . pioglitazone-metformin (ACTOPLUS MET) 15-850 MG per tablet Take 1 tablet by mouth 2 (two) times daily with a meal.      . rosuvastatin (CRESTOR) 5 MG tablet Take 5 mg by mouth daily.        . tamsulosin (FLOMAX) 0.4 MG CAPS capsule Take by mouth.      . tiotropium (SPIRIVA) 18 MCG inhalation capsule Place 18 mcg into inhaler and inhale as needed.      . Vitamin Mixture (ESTER-C  PO) Take 500 mg by mouth daily.      . carvedilol (COREG) 6.25 MG tablet Take 1 tablet (6.25 mg total) by mouth 2 (two) times daily with a meal.  60 tablet  6  . losartan-hydrochlorothiazide (HYZAAR) 100-12.5 MG per tablet Take 1 tablet by mouth daily.  30 tablet  6   No current facility-administered medications for this visit.    History   Social History  . Marital Status: Married    Spouse Name: N/A    Number of Children: N/A  . Years of Education: N/A   Occupational History  . Not on file.   Social History Main Topics  . Smoking status: Current Every Day Smoker -- 1.50 packs/day for 40 years    Types: Cigarettes  . Smokeless tobacco: Never Used  . Alcohol Use: No  . Drug Use: No  . Sexual Activity: Not on file   Other Topics Concern  . Not on file   Social History Narrative  . No  narrative on file    Family History  Problem Relation Age of Onset  . 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   ROS is negative for fevers, chills or night sweats. He does admit to slight weight gain. He does note shortness of breath. He denies episodes of chest pressure. He denies change in bowel habits. He denies any further episodes of pancreatitis. He does note leg swelling right leg and foot greater than left. He denies paresthesias.  Other system review is negative.  PE BP 130/60  Pulse 94  Ht 5\' 11"  (1.803 m)  Wt 200 lb 6.4 oz (90.901 kg)  BMI 27.96 kg/m2  Repeat blood pressure by me was 150/70. General: Alert, oriented, no distress.  Skin: normal turgor, no rashes HEENT: Normocephalic, atraumatic. Pupils round and reactive; sclera anicteric;no lid lag.  Nose without nasal septal hypertrophy Mouth/Parynx benign; Mallinpatti scale 3 Neck: No JVD, no carotid briuts Lungs: clear to ausculatation and percussion; no wheezing or rales Heart: RRR, s1 s2 normal 1/6 systolic murmur, unchanged Abdomen: Multiple surgical scars scars on his abdomen which are old ; soft, nontender; no hepatosplenomehaly, BS+; abdominal aorta nontender and not dilated by palpation. Pulses 2+ Extremities: 1+ edema to the right pretibial ankle right foot region trace on the left; no clubbing cyanosis, Homan's sign negative  Neurologic: grossly nonfocal  ECG: Sinus rhythm at 94 beats per minute. T-wave changes V4 through the 6   LABS:  BMET    Component Value Date/Time   NA 140 07/20/2011 1459   K 4.1 07/20/2011 1459   CL 102 07/20/2011 1459   CO2 30 07/20/2011 1459   GLUCOSE 127* 07/20/2011 1459   BUN 13 07/20/2011 1459   CREATININE 0.87 07/20/2011 1459   CALCIUM 9.3 07/20/2011 1459     Hepatic Function Panel     Component Value Date/Time   PROT 6.0 07/20/2011 1459   ALBUMIN 3.9 07/20/2011  1459   AST 19 07/20/2011 1459   ALT 25 07/20/2011 1459   ALKPHOS 59 07/20/2011 1459   BILITOT 0.4 07/20/2011 1459     CBC    Component Value Date/Time   WBC 8.4 07/20/2011 1459   RBC 4.89 07/20/2011 1459   HGB 16.1 07/20/2011 1459   HCT 47.2 07/20/2011 1459   PLT 153 07/20/2011 1459   MCV 96.5 07/20/2011 1459   MCH  32.9 07/20/2011 1459   MCHC 34.1 07/20/2011 1459   RDW 14.8 07/20/2011 1459   LYMPHSABS 1.4 07/20/2011 1459   MONOABS 0.6 07/20/2011 1459   EOSABS 0.2 07/20/2011 1459   BASOSABS 0.0 07/20/2011 1459     BNP No results found for this basename: probnp    Lipid Panel     Component Value Date/Time   CHOL 169 07/30/2011 1010   TRIG 132 07/30/2011 1010   HDL 34* 07/30/2011 1010   CHOLHDL 5.0 07/30/2011 1010   VLDL 26 07/30/2011 1010   LDLCALC 109* 07/30/2011 1010     RADIOLOGY: No results found.    ASSESSMENT AND PLAN: Mr. Digilio is now 62 years old. He is 16 years status post PTCA of his diagonal vessel and 11 years status post his last cardiac catheterization. He has ongoing tobacco history as well as a history of hypertension diabetes mellitus next hyperlipidemia. He does have significant new development of swelling in his lower extremities. He's only been taking amlodipine 2.5 mg and I will discontinue this altogether. His insurance company will no longer supplied Corex CR 40 mg am electing to change this to 6.25 twice a day of carvedilol. I am recommending he discontinue his regular strength losartan 50 mg and in its place I will further titrate this to losartan HCT 100/12.5. I am scheduling him for a complete set of laboratory consisting of a CBC comprehensive  metabolic panel, lipid studies, TSH.  Adjustments will be made in his medical regimen if necessary based on laboratory. I will see him in 6 weeks for followup evaluation.     Lennette Bihari, MD, [facility]  09/08/2013 3:47 PM

## 2013-09-08 NOTE — Patient Instructions (Addendum)
Your physician has recommended you make the following change in your medication: STOP THE AMLODIPINE AND THE COREG .ALSO STOP THE LOSARTAN. START losartan/hct and carvedilol  as directed on the bottles. These have already been sent to your pharmacy.  Your physician recommends that you return for lab work fasting.  Your physician recommends that you schedule a follow-up appointment in: 6 WEEKS.

## 2013-09-29 ENCOUNTER — Other Ambulatory Visit: Payer: Self-pay | Admitting: Cardiovascular Disease

## 2013-09-29 LAB — COMPREHENSIVE METABOLIC PANEL
AST: 19 U/L (ref 0–37)
Albumin: 3.9 g/dL (ref 3.5–5.2)
BUN: 12 mg/dL (ref 6–23)
CO2: 33 mEq/L — ABNORMAL HIGH (ref 19–32)
Calcium: 9 mg/dL (ref 8.4–10.5)
Chloride: 97 mEq/L (ref 96–112)
Creat: 0.87 mg/dL (ref 0.50–1.35)
Glucose, Bld: 163 mg/dL — ABNORMAL HIGH (ref 70–99)
Potassium: 4.5 mEq/L (ref 3.5–5.3)

## 2013-09-29 LAB — LIPID PANEL
Cholesterol: 111 mg/dL (ref 0–200)
HDL: 32 mg/dL — ABNORMAL LOW (ref >39)
Total CHOL/HDL Ratio: 3.5 Ratio
Triglycerides: 95 mg/dL (ref <150)

## 2013-09-29 LAB — CBC
HCT: 47.8 % (ref 39.0–52.0)
Hemoglobin: 16.3 g/dL (ref 13.0–17.0)
MCV: 96.4 fL (ref 78.0–100.0)
RDW: 13.6 % (ref 11.5–15.5)
WBC: 8.4 10*3/uL (ref 4.0–10.5)

## 2013-10-12 ENCOUNTER — Encounter: Payer: Self-pay | Admitting: *Deleted

## 2013-10-12 NOTE — Progress Notes (Signed)
Quick Note:  Lab note sent to patient. ______ 

## 2013-10-27 ENCOUNTER — Ambulatory Visit: Payer: BC Managed Care – PPO | Admitting: Cardiovascular Disease

## 2014-04-04 ENCOUNTER — Other Ambulatory Visit: Payer: Self-pay | Admitting: *Deleted

## 2014-04-04 MED ORDER — CARVEDILOL 6.25 MG PO TABS: 6.2500 mg | ORAL_TABLET | Freq: Two times a day (BID) | ORAL | Status: DC

## 2014-04-04 NOTE — Telephone Encounter (Signed)
Rx refill sent to patients pharmacy  

## 2014-04-10 ENCOUNTER — Other Ambulatory Visit: Payer: Self-pay | Admitting: *Deleted

## 2014-04-10 MED ORDER — LOSARTAN POTASSIUM-HCTZ 100-12.5 MG PO TABS: 1.0000 | ORAL_TABLET | Freq: Every day | ORAL | Status: DC

## 2014-04-10 NOTE — Telephone Encounter (Signed)
Rx was sent to pharmacy electronically. 

## 2014-11-06 ENCOUNTER — Other Ambulatory Visit: Payer: Self-pay | Admitting: Cardiovascular Disease

## 2015-01-07 ENCOUNTER — Other Ambulatory Visit: Payer: Self-pay | Admitting: Cardiovascular Disease

## 2015-01-08 NOTE — Telephone Encounter (Signed)
Rx(s) sent to pharmacy electronically.  

## 2015-02-06 ENCOUNTER — Other Ambulatory Visit: Payer: Self-pay

## 2015-02-06 MED ORDER — CARVEDILOL 6.25 MG PO TABS: 6.2500 mg | ORAL_TABLET | Freq: Two times a day (BID) | ORAL | Status: DC

## 2015-02-06 NOTE — Telephone Encounter (Signed)
Rx(s) sent to pharmacy electronically.  

## 2015-02-19 ENCOUNTER — Other Ambulatory Visit: Payer: Self-pay | Admitting: Cardiovascular Disease

## 2015-02-21 NOTE — Telephone Encounter (Signed)
Rx(s) sent to pharmacy electronically. Message sent to [facility], Dr. Evette Georges scheduler, to contact patient for an OV

## 2015-02-26 ENCOUNTER — Telehealth: Payer: Self-pay | Admitting: Cardiovascular Disease

## 2015-02-27 NOTE — Telephone Encounter (Signed)
Closed encounter °

## 2015-03-05 ENCOUNTER — Other Ambulatory Visit: Payer: Self-pay | Admitting: Cardiovascular Disease

## 2015-05-01 ENCOUNTER — Encounter: Payer: Self-pay | Admitting: *Deleted

## 2015-07-03 ENCOUNTER — Other Ambulatory Visit: Payer: Self-pay | Admitting: Surgery

## 2015-07-03 DIAGNOSIS — K4090 Unilateral inguinal hernia, without obstruction or gangrene, not specified as recurrent: Secondary | ICD-10-CM

## 2015-07-03 DIAGNOSIS — R1031 Right lower quadrant pain: Secondary | ICD-10-CM

## 2015-07-04 ENCOUNTER — Other Ambulatory Visit (HOSPITAL_COMMUNITY): Payer: Self-pay | Admitting: Surgery

## 2015-07-04 DIAGNOSIS — R1031 Right lower quadrant pain: Secondary | ICD-10-CM

## 2015-07-10 ENCOUNTER — Ambulatory Visit (HOSPITAL_COMMUNITY)
Admission: RE | Admit: 2015-07-10 | Discharge: 2015-07-10 | Disposition: A | Discharge status: Home / Self Care | Payer: BLUE CROSS/BLUE SHIELD | Source: Ambulatory Visit | Attending: Surgery | Admitting: Surgery

## 2015-07-10 DIAGNOSIS — R1031 Right lower quadrant pain: Secondary | ICD-10-CM

## 2015-07-10 DIAGNOSIS — E119 Type 2 diabetes mellitus without complications: Secondary | ICD-10-CM | POA: Diagnosis not present

## 2015-07-10 LAB — POCT I-STAT CREATININE: Creatinine, Ser: 0.9 mg/dL (ref 0.61–1.24)

## 2015-07-10 MED ORDER — IOHEXOL 300 MG/ML  SOLN
100.0000 mL | Freq: Once | INTRAMUSCULAR | Status: AC | PRN
  Administered 2015-07-10: 100 mL via INTRAVENOUS

## 2016-02-20 DIAGNOSIS — E1165 Type 2 diabetes mellitus with hyperglycemia: Secondary | ICD-10-CM | POA: Diagnosis not present

## 2016-02-20 DIAGNOSIS — F17200 Nicotine dependence, unspecified, uncomplicated: Secondary | ICD-10-CM | POA: Diagnosis not present

## 2016-02-20 DIAGNOSIS — J441 Chronic obstructive pulmonary disease with (acute) exacerbation: Secondary | ICD-10-CM | POA: Diagnosis not present

## 2016-03-25 DIAGNOSIS — I10 Essential (primary) hypertension: Secondary | ICD-10-CM | POA: Diagnosis not present

## 2016-03-25 DIAGNOSIS — J449 Chronic obstructive pulmonary disease, unspecified: Secondary | ICD-10-CM | POA: Diagnosis not present

## 2016-04-01 DIAGNOSIS — R911 Solitary pulmonary nodule: Secondary | ICD-10-CM | POA: Diagnosis not present

## 2016-05-06 DIAGNOSIS — R0902 Hypoxemia: Secondary | ICD-10-CM | POA: Diagnosis not present

## 2016-05-06 DIAGNOSIS — Z9889 Other specified postprocedural states: Secondary | ICD-10-CM | POA: Diagnosis not present

## 2016-05-06 DIAGNOSIS — R942 Abnormal results of pulmonary function studies: Secondary | ICD-10-CM | POA: Diagnosis not present

## 2016-05-06 DIAGNOSIS — R938 Abnormal findings on diagnostic imaging of other specified body structures: Secondary | ICD-10-CM | POA: Diagnosis not present

## 2016-05-06 DIAGNOSIS — J984 Other disorders of lung: Secondary | ICD-10-CM | POA: Diagnosis not present

## 2016-05-06 DIAGNOSIS — Z720 Tobacco use: Secondary | ICD-10-CM | POA: Diagnosis not present

## 2016-05-06 DIAGNOSIS — K439 Ventral hernia without obstruction or gangrene: Secondary | ICD-10-CM | POA: Diagnosis not present

## 2016-05-06 DIAGNOSIS — J449 Chronic obstructive pulmonary disease, unspecified: Secondary | ICD-10-CM | POA: Diagnosis not present

## 2016-05-06 DIAGNOSIS — I279 Pulmonary heart disease, unspecified: Secondary | ICD-10-CM | POA: Diagnosis not present

## 2016-05-18 DIAGNOSIS — J449 Chronic obstructive pulmonary disease, unspecified: Secondary | ICD-10-CM | POA: Diagnosis not present

## 2016-05-18 DIAGNOSIS — E119 Type 2 diabetes mellitus without complications: Secondary | ICD-10-CM | POA: Diagnosis not present

## 2016-05-27 DIAGNOSIS — F17200 Nicotine dependence, unspecified, uncomplicated: Secondary | ICD-10-CM | POA: Diagnosis not present

## 2016-05-27 DIAGNOSIS — E1165 Type 2 diabetes mellitus with hyperglycemia: Secondary | ICD-10-CM | POA: Diagnosis not present

## 2016-05-27 DIAGNOSIS — Z1389 Encounter for screening for other disorder: Secondary | ICD-10-CM | POA: Diagnosis not present

## 2016-05-27 DIAGNOSIS — F329 Major depressive disorder, single episode, unspecified: Secondary | ICD-10-CM | POA: Diagnosis not present

## 2016-06-03 DIAGNOSIS — R0902 Hypoxemia: Secondary | ICD-10-CM | POA: Diagnosis not present

## 2016-06-03 DIAGNOSIS — J449 Chronic obstructive pulmonary disease, unspecified: Secondary | ICD-10-CM | POA: Diagnosis not present

## 2016-06-03 DIAGNOSIS — G4733 Obstructive sleep apnea (adult) (pediatric): Secondary | ICD-10-CM | POA: Diagnosis not present

## 2016-09-28 IMAGING — CT CT ABD-PELV W/ CM
2 of 5 series · 15 of 46 positions shown, 17 images · IV contrast (omnipaque)
Comparison: 07/27/2011

CLINICAL DATA: Right groin pain for 3 months.  History of diabetes

EXAM:
CT ABDOMEN AND PELVIS WITH CONTRAST
TECHNIQUE: Multidetector CT imaging of the abdomen and pelvis was performed
using the standard protocol following bolus administration of
intravenous contrast.
CONTRAST:  100mL OMNIPAQUE IOHEXOL 300 MG/ML  SOLN

[Series 2: abd_pel_with 5.0 b40f · axial · 0.83mm/px · z∈[-548,-73]mm · 12 of 107 slices shown, 14 images]
[im 6/107  soft-tissue]
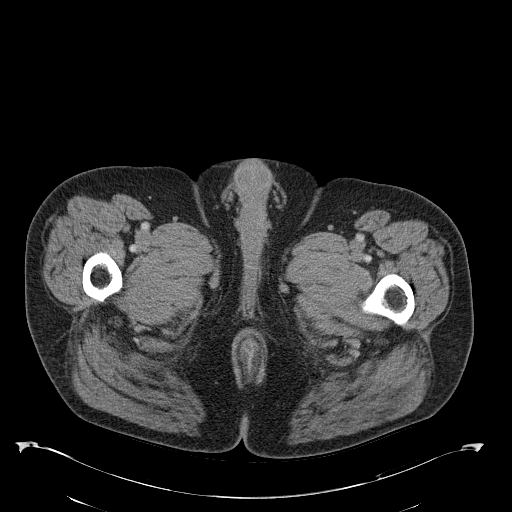
[im 6/107  bone]
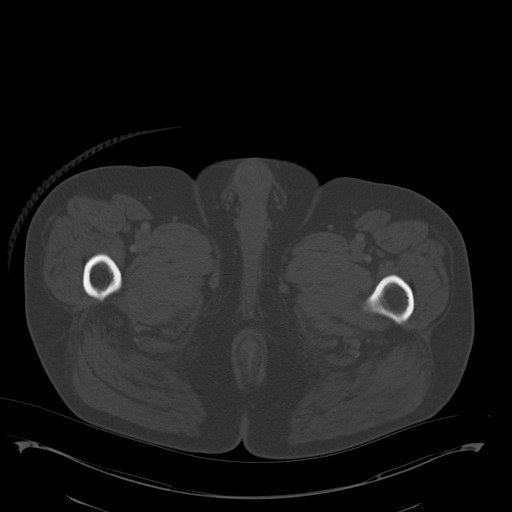
[im 18/107  soft-tissue]
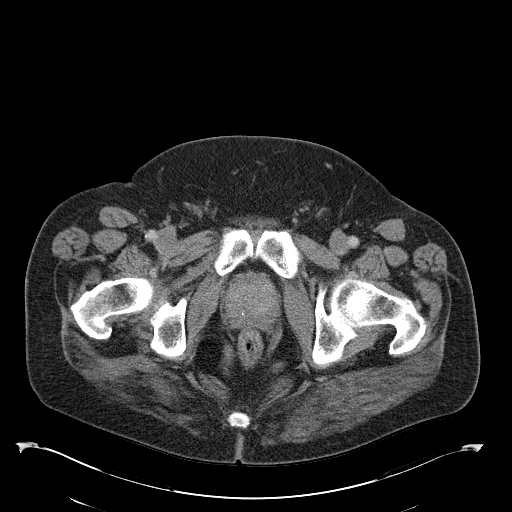
[im 24/107  soft-tissue]
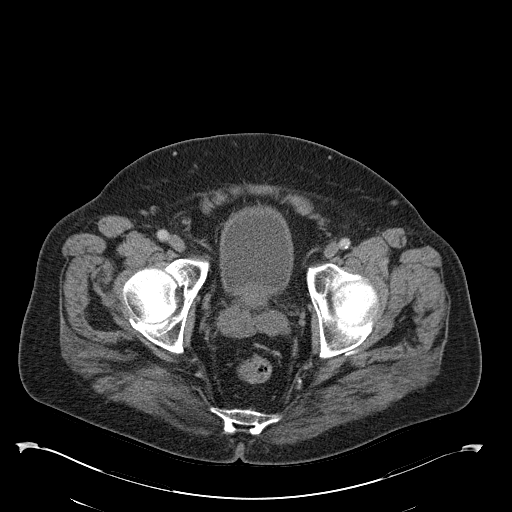
[im 30/107  soft-tissue]
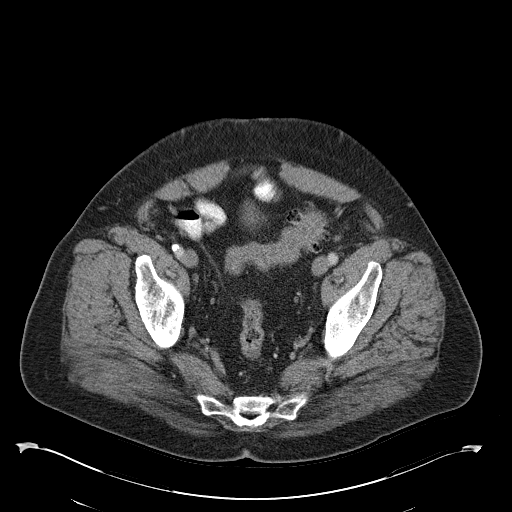
[im 42/107  soft-tissue]
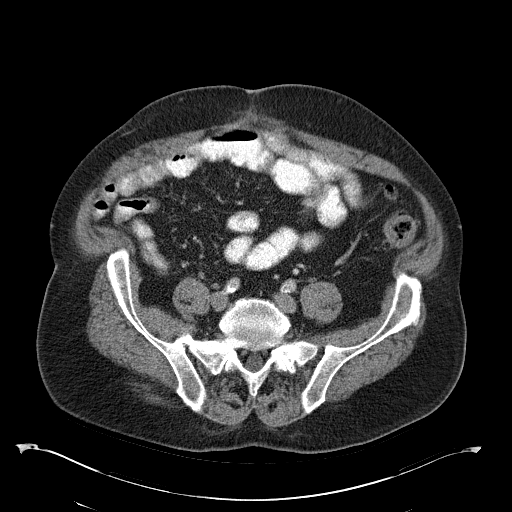
[im 48/107  soft-tissue]
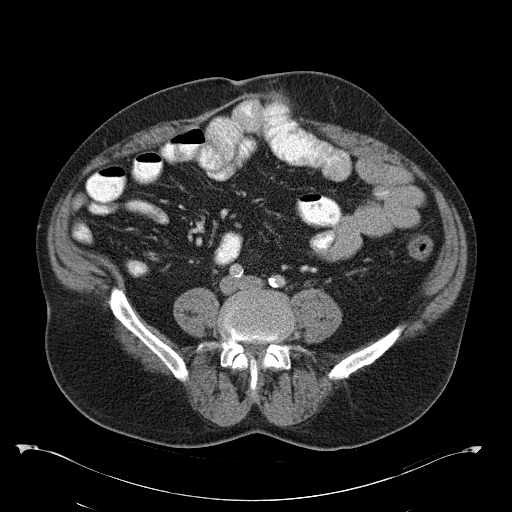
[im 59/107  soft-tissue]
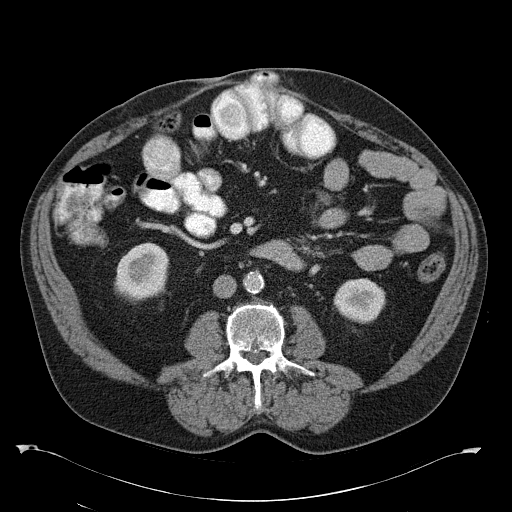
[im 65/107  soft-tissue]
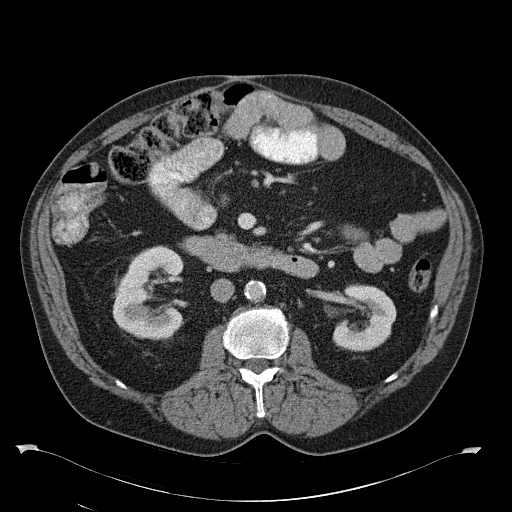
[im 77/107  soft-tissue]
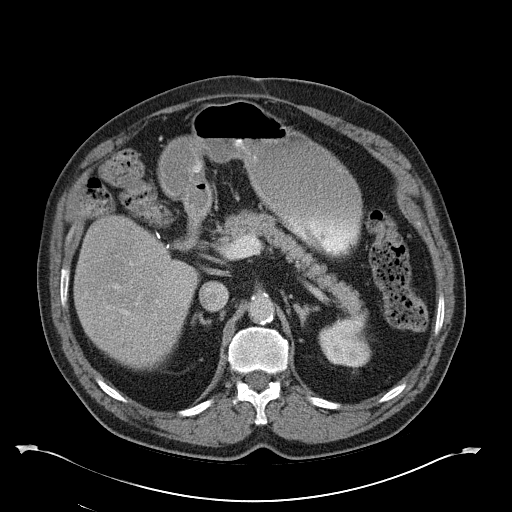
[im 77/107  bone]
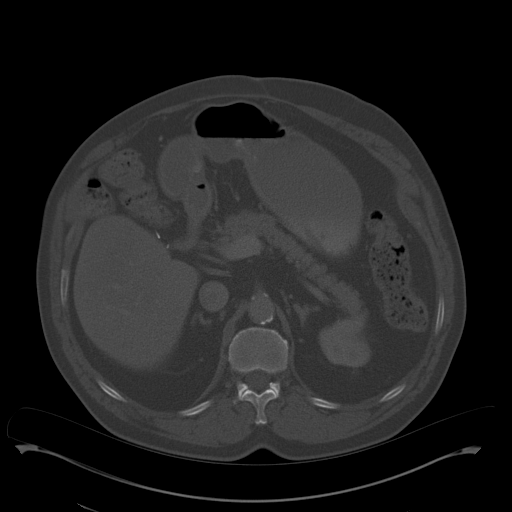
[im 83/107  soft-tissue]
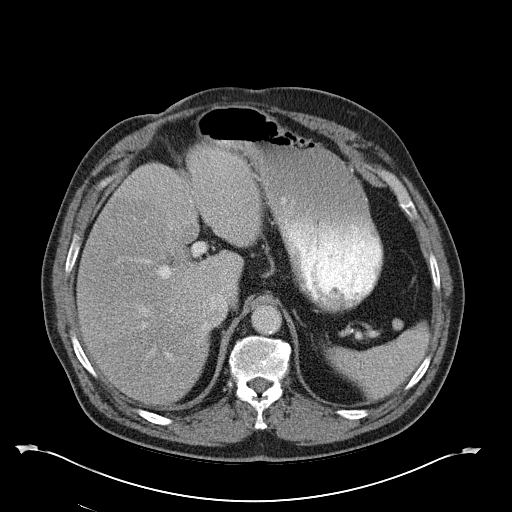
[im 89/107  soft-tissue]
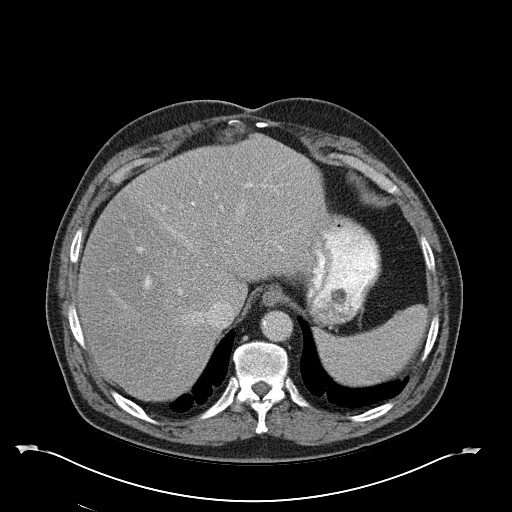
[im 101/107  soft-tissue]
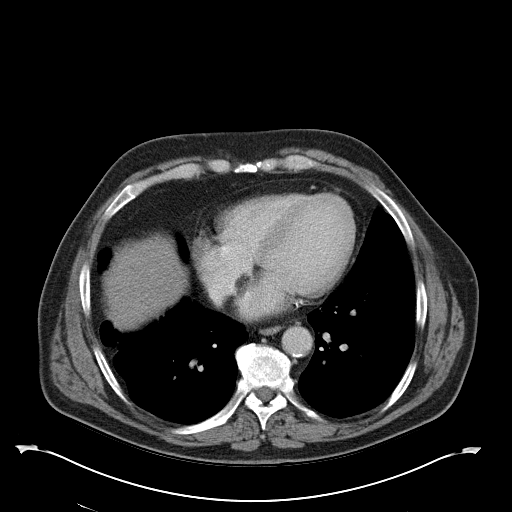

[Series 4: abd_pel_with 3.0 spo cor · coronal · 0.78mm/px · 3 of 104 slices shown]
[im 35/104  soft-tissue]
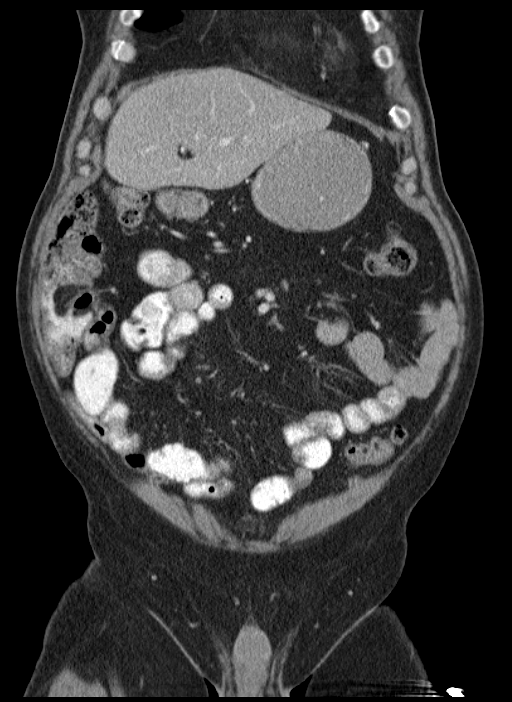
[im 46/104  soft-tissue]
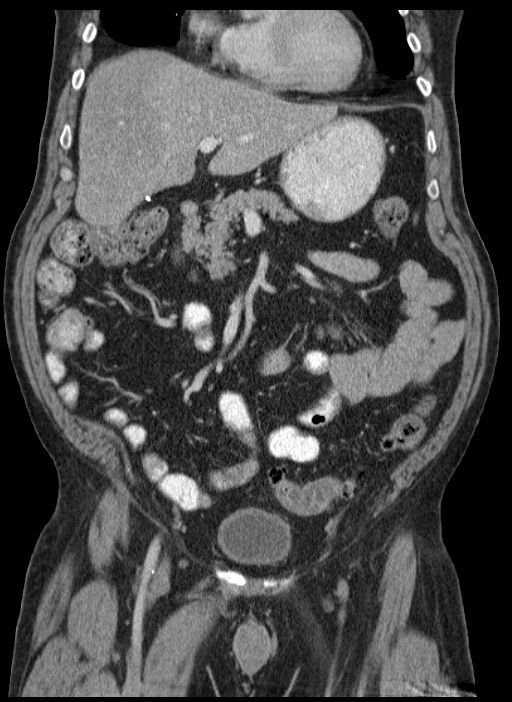
[im 58/104  soft-tissue]
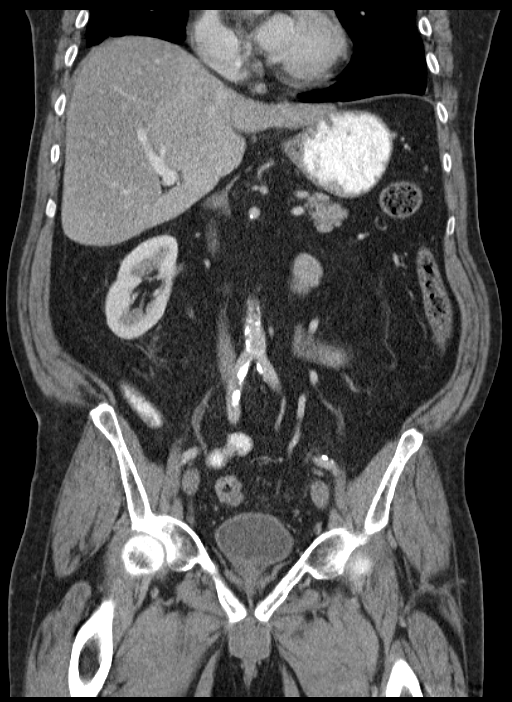

[15 of 46 positions shown; findings below may reference images not displayed]

FINDINGS: Lower chest: No pleural effusion identified. The lung bases are
clear.

Hepatobiliary: Hepatic steatosis noted. No focal liver abnormality.
Previous cholecystectomy. No biliary dilatation.

Pancreas: Calcifications within the head of pancreas are noted.

Spleen: Negative

Adrenals/Urinary Tract: The adrenal glands are negative.

Stomach/Bowel: The stomach and the small bowel loops have a normal
caliber. There is no bowel obstruction. Complex ventral abdominal
wall hernias contain nonobstructed loops of small bowel, image 69 of
series 5 and image 65 of series 5 peer The appendix is visualized
and appears normal. Normal appearance of the proximal colon. Distal
colonic diverticula noted. No acute inflammation.

Vascular/Lymphatic: Calcified atherosclerotic disease involves the
abdominal aorta. No aneurysm. No enlarged retroperitoneal or
mesenteric adenopathy. No enlarged pelvic or inguinal lymph nodes.

Reproductive: Prostate gland and seminal vesicles are on
unremarkable.

Other: There is no ascites or focal fluid collections within the
abdomen or pelvis. No findings identified within the right groin.

Musculoskeletal: The visualized osseous structures are notable for
mild lumbar spondylosis.
IMPRESSION: 1. No acute findings within the abdomen or pelvis and no findings to
explain right groin pain.
2. Changes secondary to chronic pancreatitis
3. Hepatic steatosis
4. Aortic atherosclerosis
5. Ventral abdominal wall hernia containing nonobstructed small
bowel loops.

## 2016-10-21 DIAGNOSIS — Z23 Encounter for immunization: Secondary | ICD-10-CM | POA: Diagnosis not present

## 2016-11-04 DIAGNOSIS — R942 Abnormal results of pulmonary function studies: Secondary | ICD-10-CM | POA: Diagnosis not present

## 2016-11-04 DIAGNOSIS — J449 Chronic obstructive pulmonary disease, unspecified: Secondary | ICD-10-CM | POA: Diagnosis not present

## 2016-11-04 DIAGNOSIS — J309 Allergic rhinitis, unspecified: Secondary | ICD-10-CM | POA: Diagnosis not present

## 2016-11-04 DIAGNOSIS — Z9889 Other specified postprocedural states: Secondary | ICD-10-CM | POA: Diagnosis not present

## 2016-11-04 DIAGNOSIS — G4733 Obstructive sleep apnea (adult) (pediatric): Secondary | ICD-10-CM | POA: Diagnosis not present

## 2016-11-04 DIAGNOSIS — R938 Abnormal findings on diagnostic imaging of other specified body structures: Secondary | ICD-10-CM | POA: Diagnosis not present

## 2016-11-04 DIAGNOSIS — R0902 Hypoxemia: Secondary | ICD-10-CM | POA: Diagnosis not present

## 2016-11-04 DIAGNOSIS — J984 Other disorders of lung: Secondary | ICD-10-CM | POA: Diagnosis not present

## 2016-11-04 DIAGNOSIS — Z720 Tobacco use: Secondary | ICD-10-CM | POA: Diagnosis not present

## 2017-03-17 DIAGNOSIS — Z79899 Other long term (current) drug therapy: Secondary | ICD-10-CM | POA: Diagnosis not present

## 2017-03-17 DIAGNOSIS — E119 Type 2 diabetes mellitus without complications: Secondary | ICD-10-CM | POA: Diagnosis not present

## 2017-03-17 DIAGNOSIS — J449 Chronic obstructive pulmonary disease, unspecified: Secondary | ICD-10-CM | POA: Diagnosis not present

## 2017-03-17 DIAGNOSIS — R040 Epistaxis: Secondary | ICD-10-CM | POA: Diagnosis not present

## 2017-03-18 DIAGNOSIS — J449 Chronic obstructive pulmonary disease, unspecified: Secondary | ICD-10-CM | POA: Diagnosis not present

## 2017-03-18 DIAGNOSIS — F17200 Nicotine dependence, unspecified, uncomplicated: Secondary | ICD-10-CM | POA: Diagnosis not present

## 2017-03-18 DIAGNOSIS — E119 Type 2 diabetes mellitus without complications: Secondary | ICD-10-CM | POA: Diagnosis not present

## 2017-03-18 DIAGNOSIS — Z7984 Long term (current) use of oral hypoglycemic drugs: Secondary | ICD-10-CM | POA: Diagnosis not present

## 2017-03-18 DIAGNOSIS — Z4800 Encounter for change or removal of nonsurgical wound dressing: Secondary | ICD-10-CM | POA: Diagnosis not present

## 2017-03-18 DIAGNOSIS — Z4801 Encounter for change or removal of surgical wound dressing: Secondary | ICD-10-CM | POA: Diagnosis not present

## 2017-03-18 DIAGNOSIS — Z7982 Long term (current) use of aspirin: Secondary | ICD-10-CM | POA: Diagnosis not present

## 2017-03-18 DIAGNOSIS — Z79899 Other long term (current) drug therapy: Secondary | ICD-10-CM | POA: Diagnosis not present

## 2017-03-24 DIAGNOSIS — Z713 Dietary counseling and surveillance: Secondary | ICD-10-CM | POA: Diagnosis not present

## 2017-03-24 DIAGNOSIS — E786 Lipoprotein deficiency: Secondary | ICD-10-CM | POA: Diagnosis not present

## 2017-03-24 DIAGNOSIS — N481 Balanitis: Secondary | ICD-10-CM | POA: Diagnosis not present

## 2017-03-24 DIAGNOSIS — J441 Chronic obstructive pulmonary disease with (acute) exacerbation: Secondary | ICD-10-CM | POA: Diagnosis not present

## 2017-03-24 DIAGNOSIS — Z6829 Body mass index (BMI) 29.0-29.9, adult: Secondary | ICD-10-CM | POA: Diagnosis not present

## 2017-03-24 DIAGNOSIS — F329 Major depressive disorder, single episode, unspecified: Secondary | ICD-10-CM | POA: Diagnosis not present

## 2017-03-24 DIAGNOSIS — E1165 Type 2 diabetes mellitus with hyperglycemia: Secondary | ICD-10-CM | POA: Diagnosis not present

## 2017-03-24 DIAGNOSIS — Z299 Encounter for prophylactic measures, unspecified: Secondary | ICD-10-CM | POA: Diagnosis not present

## 2017-03-24 DIAGNOSIS — I10 Essential (primary) hypertension: Secondary | ICD-10-CM | POA: Diagnosis not present

## 2017-03-24 DIAGNOSIS — J449 Chronic obstructive pulmonary disease, unspecified: Secondary | ICD-10-CM | POA: Diagnosis not present

## 2017-04-05 ENCOUNTER — Emergency Department (HOSPITAL_COMMUNITY)
Admission: EM | Admit: 2017-04-05 | Discharge: 2017-04-06 | Disposition: A | Discharge status: Home / Self Care | Payer: Medicare Other | Attending: Emergency Medicine | Admitting: Emergency Medicine

## 2017-04-05 ENCOUNTER — Encounter (HOSPITAL_COMMUNITY): Payer: Self-pay

## 2017-04-05 DIAGNOSIS — I10 Essential (primary) hypertension: Secondary | ICD-10-CM | POA: Diagnosis not present

## 2017-04-05 DIAGNOSIS — Z955 Presence of coronary angioplasty implant and graft: Secondary | ICD-10-CM | POA: Insufficient documentation

## 2017-04-05 DIAGNOSIS — Z6829 Body mass index (BMI) 29.0-29.9, adult: Secondary | ICD-10-CM | POA: Diagnosis not present

## 2017-04-05 DIAGNOSIS — I2510 Atherosclerotic heart disease of native coronary artery without angina pectoris: Secondary | ICD-10-CM | POA: Insufficient documentation

## 2017-04-05 DIAGNOSIS — Z7984 Long term (current) use of oral hypoglycemic drugs: Secondary | ICD-10-CM | POA: Diagnosis not present

## 2017-04-05 DIAGNOSIS — Z79899 Other long term (current) drug therapy: Secondary | ICD-10-CM | POA: Diagnosis not present

## 2017-04-05 DIAGNOSIS — F329 Major depressive disorder, single episode, unspecified: Secondary | ICD-10-CM | POA: Diagnosis not present

## 2017-04-05 DIAGNOSIS — E1165 Type 2 diabetes mellitus with hyperglycemia: Secondary | ICD-10-CM | POA: Diagnosis not present

## 2017-04-05 DIAGNOSIS — E119 Type 2 diabetes mellitus without complications: Secondary | ICD-10-CM | POA: Diagnosis not present

## 2017-04-05 DIAGNOSIS — F17210 Nicotine dependence, cigarettes, uncomplicated: Secondary | ICD-10-CM | POA: Diagnosis not present

## 2017-04-05 DIAGNOSIS — Z7982 Long term (current) use of aspirin: Secondary | ICD-10-CM | POA: Insufficient documentation

## 2017-04-05 DIAGNOSIS — N472 Paraphimosis: Secondary | ICD-10-CM | POA: Diagnosis not present

## 2017-04-05 DIAGNOSIS — Z299 Encounter for prophylactic measures, unspecified: Secondary | ICD-10-CM | POA: Diagnosis not present

## 2017-04-05 DIAGNOSIS — K219 Gastro-esophageal reflux disease without esophagitis: Secondary | ICD-10-CM | POA: Diagnosis not present

## 2017-04-05 DIAGNOSIS — N4889 Other specified disorders of penis: Secondary | ICD-10-CM | POA: Diagnosis present

## 2017-04-05 LAB — BASIC METABOLIC PANEL
ANION GAP: 7 (ref 5–15)
BUN: 18 mg/dL (ref 6–20)
CALCIUM: 9.5 mg/dL (ref 8.9–10.3)
CO2: 29 mmol/L (ref 22–32)
CREATININE: 1.03 mg/dL (ref 0.61–1.24)
Chloride: 100 mmol/L — ABNORMAL LOW (ref 101–111)
GFR calc Af Amer: >60 mL/min (ref >60)
Glucose, Bld: 349 mg/dL — ABNORMAL HIGH (ref 65–99)
Potassium: 4.1 mmol/L (ref 3.5–5.1)
Sodium: 136 mmol/L (ref 135–145)

## 2017-04-05 LAB — URINALYSIS, ROUTINE W REFLEX MICROSCOPIC
BILIRUBIN URINE: NEGATIVE
Bacteria, UA: NONE SEEN
Hgb urine dipstick: NEGATIVE
KETONES UR: NEGATIVE mg/dL
Leukocytes, UA: NEGATIVE
NITRITE: NEGATIVE
PROTEIN: 30 mg/dL — AB
Specific Gravity, Urine: 1.023 (ref 1.005–1.030)
Squamous Epithelial / LPF: NONE SEEN
pH: 5 (ref 5.0–8.0)

## 2017-04-05 LAB — CBC
HCT: 60.3 % — ABNORMAL HIGH (ref 39.0–52.0)
HEMOGLOBIN: 13.8 g/dL (ref 13.0–17.0)
MCH: 31.2 pg (ref 26.0–34.0)
MCHC: 22.9 g/dL — ABNORMAL LOW (ref 30.0–36.0)
MCV: 136.1 fL — ABNORMAL HIGH (ref 78.0–100.0)
PLATELETS: 306 10*3/uL (ref 150–400)
RBC: 4.43 MIL/uL (ref 4.22–5.81)
RDW: 14.2 % (ref 11.5–15.5)
WBC: 8.3 10*3/uL (ref 4.0–10.5)

## 2017-04-05 MED ORDER — SODIUM CHLORIDE 0.9 % IV BOLUS (SEPSIS)
1000.0000 mL | Freq: Once | INTRAVENOUS | Status: AC
  Administered 2017-04-05: 1000 mL via INTRAVENOUS

## 2017-04-05 MED ORDER — LIDOCAINE HCL 2 % EX GEL
1.0000 "application " | Freq: Once | CUTANEOUS | Status: AC
  Administered 2017-04-05: 1 via URETHRAL
  Filled 2017-04-05: qty 11

## 2017-04-05 NOTE — ED Provider Notes (Signed)
Paraphimosis Swelling and dysuria for 6 days ceftin and monostat without relief attempt to reduce by Dr. Ellender Hose failed Consult to Dr. Alinda Money   Discussed with Dr. Alinda Money who will see the patient in the ED.   00:45 - paraphimosis successfully reduced by Dr. Alinda Money. He advises ok to discharge and have office follow up.   Charlann Lange, PA-C 04/06/17 [id number]    Duffy Bruce, MD 04/07/17 [id number]

## 2017-04-05 NOTE — ED Provider Notes (Signed)
[facility] Lee DEPT Provider Note   CSN: [id number] Arrival date & time: 04/05/17  1755     History   Chief Complaint Chief Complaint  Patient presents with  . Groin Swelling    HPI Warren Lee is a 66 y.o. male.  HPI   Pt p/w swelling and pain of the distal penis.  Has had one week of symptoms in which he had dysuria and swelling in the distal penis.  Has been prescribed ceftin and monistat 7.  Has also used vaseline and aloe without relief.  Was sent by PCP today to ED with the expectation of seeing Dr Alinda Money of urology.   Pt denies fevers, abdominal pain, N/V, bowel changes, testicular or scrotal pain or swelling.  Blood sugars not well controlled, recent HA1c was 9.    Pt states he was circumcised as an infant but does have to pull his foreskin back.  States he has not been able to pull it forward this week due to swelling.    Past Medical History:  Diagnosis Date  . Abdominal wall fistula   . Anxiety   . Chronic back pain   . Chronic pancreatitis [facility])    CT July 2012: Chronic calcific pancreatitis without evidence of acute  . Coronary artery disease   . Diabetes mellitus   . Heart disease   . Hypercholesterolemia   . Hypertension   . Pancreatitis   . S/P colonoscopy 2007   Dr. Lindalou Hose: normal   . S/P endoscopy July 2012   normal  . Shortness of breath   . Systolic murmur     Patient Active Problem List   Diagnosis Date Noted  . CAD (coronary artery disease) 09/08/2013  . Hyperlipidemia LDL goal < 70 09/08/2013  . GERD (gastroesophageal reflux disease) 09/08/2013  . DM2 (diabetes mellitus, type 2) ([place]) 09/08/2013  . Epigastric pain 07/21/2011  . Abdominal pain, other specified site 07/21/2011    Past Surgical History:  Procedure Laterality Date  . CARDIAC CATHETERIZATION  2003  . CHEST TUBE INSERTION    . CHOLECYSTECTOMY     with complications, prolonged hospitalization, with bile leak  . ESOPHAGOGASTRODUODENOSCOPY  07/22/2011   Procedure:  ESOPHAGOGASTRODUODENOSCOPY (EGD);  Surgeon: Daneil Dolin, MD;  Location: [facility];  Service: Endoscopy;  Laterality: N/A;  . exploratory laparotomy X 2     after complications with chole  . PTCA  1998  . TRACHEOSTOMY         Home Medications    Prior to Admission medications   Medication Sig Start Date End Date Taking? Authorizing Provider  ANORO ELLIPTA 62.5-25 MCG/INH AEPB Inhale 1 puff into the lungs daily. 03/12/17  Yes Historical Provider, MD  aspirin 325 MG tablet Take 325 mg by mouth daily.   Yes Historical Provider, MD  carvedilol (COREG) 6.25 MG tablet TAKE 1 TABLET BY MOUTH TWICE A DAY WITH A MEAL (MUST MAKE APPT FOR MORE REFILLS) 03/05/15  Yes Troy Sine, MD  cefUROXime (CEFTIN) 250 MG tablet Take 250 mg by mouth 2 (two) times daily with a meal.   Yes Historical Provider, MD  fish oil-omega-3 fatty acids 1000 MG capsule Take 2 g by mouth daily.    Yes Historical Provider, MD  glimepiride (AMARYL) 2 MG tablet Take 2 mg by mouth daily.  07/16/11  Yes Historical Provider, MD  losartan-hydrochlorothiazide (HYZAAR) 100-12.5 MG per tablet Take 1 tablet by mouth daily. 04/10/14  Yes Troy Sine, MD  metFORMIN (GLUCOPHAGE) 850 MG tablet  Take 850 mg by mouth 2 (two) times daily with a meal.   Yes Historical Provider, MD  mometasone-formoterol (DULERA) 200-5 MCG/ACT AERO Inhale 2 puffs into the lungs daily.    Yes Historical Provider, MD  Multiple Vitamin (MULTIVITAMIN WITH MINERALS) TABS tablet Take 1 tablet by mouth daily.   Yes Historical Provider, MD  PARoxetine (PAXIL) 20 MG tablet Take 20 mg by mouth daily.  07/09/11  Yes Historical Provider, MD  pioglitazone (ACTOS) 30 MG tablet Take 30 mg by mouth daily.   Yes Historical Provider, MD  rosuvastatin (CRESTOR) 10 MG tablet Take 10 mg by mouth at bedtime. 02/19/17  Yes Historical Provider, MD  betamethasone valerate (VALISONE) 0.1 % cream Apply 1 application topically 2 (two) times daily as needed. Applied to affected areas of  skin as needed 04/05/17   Historical Provider, MD    Family History Family History  Problem Relation Age of Onset  . Heart failure Father   . Colon cancer Neg Hx     Social History Social History  Substance Use Topics  . Smoking status: Current Every Day Smoker    Packs/day: 1.50    Years: 40.00    Types: Cigarettes  . Smokeless tobacco: Never Used  . Alcohol use No     Allergies   Patient has no known allergies.   Review of Systems Review of Systems  All other systems reviewed and are negative.    Physical Exam Updated Vital Signs BP (!) 159/73 (BP Location: Right Arm)   Pulse 74   Temp 97.7 F (36.5 C) (Oral)   Resp 20   Ht 5\' 11"  (1.803 m)   Wt 90.7 kg   SpO2 100%   BMI 27.89 kg/m   Physical Exam  Constitutional: He appears well-developed and well-nourished. No distress.  HENT:  Head: Normocephalic and atraumatic.  Neck: Neck supple.  Cardiovascular: Normal rate and regular rhythm.   Pulmonary/Chest: Effort normal and breath sounds normal. No respiratory distress. He has no wheezes. He has no rales.  Abdominal: Soft. He exhibits no distension and no mass. There is no tenderness. There is no rebound and no guarding.  Genitourinary: Testes normal. Right testis shows no mass, no swelling and no tenderness. Left testis shows no mass, no swelling and no tenderness. Paraphimosis, penile erythema and penile tenderness present.  Genitourinary Comments: Distal penis with edema, splotchy erythema.    Neurological: He is alert. He exhibits normal muscle tone.  Skin: He is not diaphoretic.  Nursing note and vitals reviewed.    ED Treatments / Results  Labs (all labs ordered are listed, but only abnormal results are displayed) Labs Reviewed  URINALYSIS, ROUTINE W REFLEX MICROSCOPIC - Abnormal; Notable for the following:       Result Value   APPearance HAZY (*)    Glucose, UA >=500 (*)    Protein, ur 30 (*)    All other components within normal limits  BASIC  METABOLIC PANEL - Abnormal; Notable for the following:    Chloride 100 (*)    Glucose, Bld 349 (*)    All other components within normal limits  CBC - Abnormal; Notable for the following:    HCT 60.3 (*)    MCV 136.1 (*)    MCHC 22.9 (*)    All other components within normal limits    EKG  EKG Interpretation None       Radiology No results found.  Procedures Procedures (including critical care time)  Medications Ordered in  ED Medications  lidocaine (XYLOCAINE) 2 % jelly 1 application (1 application Urethral Given 04/05/17 2124)  sodium chloride 0.9 % bolus 1,000 mL (1,000 mLs Intravenous New Bag/Given 04/05/17 2154)     Initial Impression / Assessment and Plan / ED Course  I have reviewed the triage vital signs and the nursing notes.  Pertinent labs & imaging results that were available during my care of the patient were reviewed by me and considered in my medical decision making (see chart for details).  Clinical Course as of Apr 05 2305  Mon Apr 05, 2017  2102 Pt seen by Dr Ellender Hose.  Per his recommendation will place ice and viscous lidocaine and he will attempt to reduce paraphimosis.    [EW]    Clinical Course User Index [EW] Clayton Bibles, PA-C    Afebrile nontoxic patient with paraphimosis - swelling of remaining foreskin and glans.  Attempted reduction by Dr Ellender Hose unsuccessful.  Urology paged for consult.  Pt discussed with Charlann Lange, PA-C, who will request urology consultation from Dr Alinda Money.    Final Clinical Impressions(s) / ED Diagnoses   Final diagnoses:  None    New Prescriptions New Prescriptions   No medications on file     Clayton Bibles, PA-C 04/05/17 2306    Duffy Bruce, MD 04/07/17 1334

## 2017-04-05 NOTE — ED Triage Notes (Signed)
PT SENT BY HIS PCP FOR PENILE SWELLING X6 DAYS. PT STS HE STARTED WITH DYSURIA, AND GIVEN CEFTIN, BUT THEN HE STARTED SWELLING. DENIES FEVER.

## 2017-04-06 DIAGNOSIS — N472 Paraphimosis: Secondary | ICD-10-CM | POA: Diagnosis not present

## 2017-04-06 MED ORDER — HYDROCODONE-ACETAMINOPHEN 5-325 MG PO TABS: 1.0000 | ORAL_TABLET | ORAL | 0 refills | Status: DC | PRN

## 2017-04-06 NOTE — Consult Note (Signed)
Urology Consult   Physician requesting consult: Dr. Ellender Hose  Reason for consult: Paraphimosis  History of Present Illness: Warren Lee is a 66 y.o. uncircumcised male whopulled his foreskin back approximately 1 week ago to place some cream on his penis because it was irritated.  His foreskin has been retracted for 1 week.  He started having increased discomfort and pain tended to his primary care physician today.  They called our office toward the end of the day and were recommended to present the emergency room for further evaluation.  He denies this type of problem before.  He denies having problems retracting his foreskin.  He denies significant difficulties voiding.  He denies a history of voiding or storage urinary symptoms, hematuria, UTIs, STDs, urolithiasis, GU malignancy/trauma/surgery.  Past Medical History:  Diagnosis Date  . Abdominal wall fistula   . Anxiety   . Chronic back pain   . Chronic pancreatitis [facility])    CT July 2012: Chronic calcific pancreatitis without evidence of acute  . Coronary artery disease   . Diabetes mellitus   . Heart disease   . Hypercholesterolemia   . Hypertension   . Pancreatitis   . S/P colonoscopy 2007   Dr. Lindalou Hose: normal   . S/P endoscopy July 2012   normal  . Shortness of breath   . Systolic murmur     Past Surgical History:  Procedure Laterality Date  . CARDIAC CATHETERIZATION  2003  . CHEST TUBE INSERTION    . CHOLECYSTECTOMY     with complications, prolonged hospitalization, with bile leak  . ESOPHAGOGASTRODUODENOSCOPY  07/22/2011   Procedure: ESOPHAGOGASTRODUODENOSCOPY (EGD);  Surgeon: Daneil Dolin, MD;  [facility];  Service: Endoscopy;  Laterality: N/A;  . exploratory laparotomy X 2     after complications with chole  . PTCA  1998  . TRACHEOSTOMY      Medications:  Home meds:  Current Meds  Medication Sig  . ANORO ELLIPTA 62.5-25 MCG/INH AEPB Inhale 1 puff into the lungs daily.  Marland Kitchen aspirin 325 MG  tablet Take 325 mg by mouth daily.  . carvedilol (COREG) 6.25 MG tablet TAKE 1 TABLET BY MOUTH TWICE A DAY WITH A MEAL (MUST MAKE APPT FOR MORE REFILLS)  . cefUROXime (CEFTIN) 250 MG tablet Take 250 mg by mouth 2 (two) times daily with a meal.  . fish oil-omega-3 fatty acids 1000 MG capsule Take 2 g by mouth daily.   Marland Kitchen glimepiride (AMARYL) 2 MG tablet Take 2 mg by mouth daily.   Marland Kitchen losartan-hydrochlorothiazide (HYZAAR) 100-12.5 MG per tablet Take 1 tablet by mouth daily.  . metFORMIN (GLUCOPHAGE) 850 MG tablet Take 850 mg by mouth 2 (two) times daily with a meal.  . mometasone-formoterol (DULERA) 200-5 MCG/ACT AERO Inhale 2 puffs into the lungs daily.   . Multiple Vitamin (MULTIVITAMIN WITH MINERALS) TABS tablet Take 1 tablet by mouth daily.  Marland Kitchen PARoxetine (PAXIL) 20 MG tablet Take 20 mg by mouth daily.   . pioglitazone (ACTOS) 30 MG tablet Take 30 mg by mouth daily.  . rosuvastatin (CRESTOR) 10 MG tablet Take 10 mg by mouth at bedtime.  . [DISCONTINUED] rosuvastatin (CRESTOR) 5 MG tablet Take 5 mg by mouth daily.      Scheduled Meds: Continuous Infusions: PRN Meds:.  Allergies: No Known Allergies  Family History  Problem Relation Age of Onset  . Heart failure Father   . Colon cancer Neg Hx     Social History:  reports that he has been smoking  Cigarettes.  He has a 60.00 pack-year smoking history. He has never used smokeless tobacco. He reports that he does not drink alcohol or use drugs.  ROS: A complete review of systems was performed.  All systems are negative except for pertinent findings as noted.  Physical Exam:  Vital signs in last 24 hours: Temp:  [97.7 F (36.5 C)] 97.7 F (36.5 C) (04/09 1803) Pulse Rate:  [68-89] 74 (04/09 2252) Resp:  [15-20] 20 (04/09 2252) BP: (149-171)/(66-73) 159/73 (04/09 2252) SpO2:  [94 %-100 %] 100 % (04/09 2252) Weight:  [90.7 kg (200 lb)] 90.7 kg (200 lb) (04/09 1820) Constitutional:  Alert and oriented, No acute distress GI: Abdomen is  soft, nontender, nondistended, no abdominal masses. He has a large well-healed midline scar with evidence of prior retention sutures. Genitourinary: No CVAT.Testes are descended bilaterally and non-tender and without masses, scrotum is normal in appearance without lesions or masses, perineum is normal on inspection. He has an edematous glans penis the foreskin retracted behind the head of the penis.  This is tender on palpation.  No penile masses or other abnormalities. Lymphatic: No lymphadenopathy Neurologic: Grossly intact, no focal deficits Psychiatric: Normal mood and affect  Laboratory Data:   Recent Labs  04/05/17 1824  WBC 8.3  HGB 13.8  HCT 60.3*  PLT 306     Recent Labs  04/05/17 1824  NA 136  K 4.1  CL 100*  GLUCOSE 349*  BUN 18  CALCIUM 9.5  CREATININE 1.03     Results for orders placed or performed during the hospital encounter of 04/05/17 (from the past 24 hour(s))  Basic metabolic panel     Status: Abnormal   Collection Time: 04/05/17  6:24 PM  Result Value Ref Range   Sodium 136 135 - 145 mmol/L   Potassium 4.1 3.5 - 5.1 mmol/L   Chloride 100 (L) 101 - 111 mmol/L   CO2 29 22 - 32 mmol/L   Glucose, Bld 349 (H) 65 - 99 mg/dL   BUN 18 6 - 20 mg/dL   Creatinine, Ser 1.03 0.61 - 1.24 mg/dL   Calcium 9.5 8.9 - 10.3 mg/dL   GFR calc non Af Amer >60 >60 mL/min   GFR calc Af Amer >60 >60 mL/min   Anion gap 7 5 - 15  CBC     Status: Abnormal   Collection Time: 04/05/17  6:24 PM  Result Value Ref Range   WBC 8.3 4.0 - 10.5 K/uL   RBC 4.43 4.22 - 5.81 MIL/uL   Hemoglobin 13.8 13.0 - 17.0 g/dL   HCT 60.3 (H) 39.0 - 52.0 %   MCV 136.1 (H) 78.0 - 100.0 fL   MCH 31.2 26.0 - 34.0 pg   MCHC 22.9 (L) 30.0 - 36.0 g/dL   RDW 14.2 11.5 - 15.5 %   Platelets 306 150 - 400 K/uL  Urinalysis, Routine w reflex microscopic- may I&O cath if menses     Status: Abnormal   Collection Time: 04/05/17  6:34 PM  Result Value Ref Range   Color, Urine YELLOW YELLOW   APPearance  HAZY (A) CLEAR   Specific Gravity, Urine 1.023 1.005 - 1.030   pH 5.0 5.0 - 8.0   Glucose, UA >=500 (A) NEGATIVE mg/dL   Hgb urine dipstick NEGATIVE NEGATIVE   Bilirubin Urine NEGATIVE NEGATIVE   Ketones, ur NEGATIVE NEGATIVE mg/dL   Protein, ur 30 (A) NEGATIVE mg/dL   Nitrite NEGATIVE NEGATIVE   Leukocytes, UA NEGATIVE NEGATIVE  RBC / HPF 0-5 0 - 5 RBC/hpf   WBC, UA 0-5 0 - 5 WBC/hpf   Bacteria, UA NONE SEEN NONE SEEN   Squamous Epithelial / LPF NONE SEEN NONE SEEN   No results found for this or any previous visit (from the past 240 hour(s)).  Renal Function:  Recent Labs  04/05/17 1824  CREATININE 1.03    Procedure:  I squeezed the glans penis to remove the edema from the glans penis.  I then retracted the foreskin back over the glans penis and reduced his paraphimosis successfully.  Impression/Recommendation Paraphimosis: This has been reduced.  I have instructed the patient not to retract his foreskin completely for 1 week and to make sure that he does replace the foreskin over the glans penis in the future and not to leave a pullback for an extended period of time.  He expressed his understanding.  Jerah Esty,LES 04/06/2017, 12:57 AM    Pryor Curia MD  CC: Dr. Ellender Hose

## 2017-04-21 DIAGNOSIS — N476 Balanoposthitis: Secondary | ICD-10-CM | POA: Diagnosis not present

## 2017-04-21 DIAGNOSIS — N471 Phimosis: Secondary | ICD-10-CM | POA: Diagnosis not present

## 2017-04-21 DIAGNOSIS — R350 Frequency of micturition: Secondary | ICD-10-CM | POA: Diagnosis not present

## 2017-04-26 DIAGNOSIS — N472 Paraphimosis: Secondary | ICD-10-CM | POA: Diagnosis not present

## 2017-04-26 DIAGNOSIS — Z6829 Body mass index (BMI) 29.0-29.9, adult: Secondary | ICD-10-CM | POA: Diagnosis not present

## 2017-04-26 DIAGNOSIS — J449 Chronic obstructive pulmonary disease, unspecified: Secondary | ICD-10-CM | POA: Diagnosis not present

## 2017-04-26 DIAGNOSIS — F17200 Nicotine dependence, unspecified, uncomplicated: Secondary | ICD-10-CM | POA: Diagnosis not present

## 2017-04-26 DIAGNOSIS — Z299 Encounter for prophylactic measures, unspecified: Secondary | ICD-10-CM | POA: Diagnosis not present

## 2017-04-26 DIAGNOSIS — F329 Major depressive disorder, single episode, unspecified: Secondary | ICD-10-CM | POA: Diagnosis not present

## 2017-04-26 DIAGNOSIS — E786 Lipoprotein deficiency: Secondary | ICD-10-CM | POA: Diagnosis not present

## 2017-04-26 DIAGNOSIS — Z1389 Encounter for screening for other disorder: Secondary | ICD-10-CM | POA: Diagnosis not present

## 2017-04-26 DIAGNOSIS — I2510 Atherosclerotic heart disease of native coronary artery without angina pectoris: Secondary | ICD-10-CM | POA: Diagnosis not present

## 2017-04-26 DIAGNOSIS — I340 Nonrheumatic mitral (valve) insufficiency: Secondary | ICD-10-CM | POA: Diagnosis not present

## 2017-04-26 DIAGNOSIS — E1165 Type 2 diabetes mellitus with hyperglycemia: Secondary | ICD-10-CM | POA: Diagnosis not present

## 2017-04-26 DIAGNOSIS — N400 Enlarged prostate without lower urinary tract symptoms: Secondary | ICD-10-CM | POA: Diagnosis not present

## 2017-04-29 DIAGNOSIS — N471 Phimosis: Secondary | ICD-10-CM | POA: Diagnosis not present

## 2017-05-04 DIAGNOSIS — R938 Abnormal findings on diagnostic imaging of other specified body structures: Secondary | ICD-10-CM | POA: Diagnosis not present

## 2017-05-04 DIAGNOSIS — J309 Allergic rhinitis, unspecified: Secondary | ICD-10-CM | POA: Diagnosis not present

## 2017-05-04 DIAGNOSIS — Z720 Tobacco use: Secondary | ICD-10-CM | POA: Diagnosis not present

## 2017-05-04 DIAGNOSIS — J984 Other disorders of lung: Secondary | ICD-10-CM | POA: Diagnosis not present

## 2017-05-04 DIAGNOSIS — R942 Abnormal results of pulmonary function studies: Secondary | ICD-10-CM | POA: Diagnosis not present

## 2017-05-04 DIAGNOSIS — G4733 Obstructive sleep apnea (adult) (pediatric): Secondary | ICD-10-CM | POA: Diagnosis not present

## 2017-05-04 DIAGNOSIS — R0902 Hypoxemia: Secondary | ICD-10-CM | POA: Diagnosis not present

## 2017-05-04 DIAGNOSIS — J449 Chronic obstructive pulmonary disease, unspecified: Secondary | ICD-10-CM | POA: Diagnosis not present

## 2017-05-04 DIAGNOSIS — Z9889 Other specified postprocedural states: Secondary | ICD-10-CM | POA: Diagnosis not present

## 2017-06-02 DIAGNOSIS — F17200 Nicotine dependence, unspecified, uncomplicated: Secondary | ICD-10-CM | POA: Diagnosis not present

## 2017-06-02 DIAGNOSIS — F419 Anxiety disorder, unspecified: Secondary | ICD-10-CM | POA: Diagnosis not present

## 2017-06-02 DIAGNOSIS — E1165 Type 2 diabetes mellitus with hyperglycemia: Secondary | ICD-10-CM | POA: Diagnosis not present

## 2017-06-02 DIAGNOSIS — I340 Nonrheumatic mitral (valve) insufficiency: Secondary | ICD-10-CM | POA: Diagnosis not present

## 2017-06-02 DIAGNOSIS — J449 Chronic obstructive pulmonary disease, unspecified: Secondary | ICD-10-CM | POA: Diagnosis not present

## 2017-06-02 DIAGNOSIS — I2510 Atherosclerotic heart disease of native coronary artery without angina pectoris: Secondary | ICD-10-CM | POA: Diagnosis not present

## 2017-06-02 DIAGNOSIS — K219 Gastro-esophageal reflux disease without esophagitis: Secondary | ICD-10-CM | POA: Diagnosis not present

## 2017-06-02 DIAGNOSIS — Z6829 Body mass index (BMI) 29.0-29.9, adult: Secondary | ICD-10-CM | POA: Diagnosis not present

## 2017-06-02 DIAGNOSIS — F329 Major depressive disorder, single episode, unspecified: Secondary | ICD-10-CM | POA: Diagnosis not present

## 2017-06-02 DIAGNOSIS — I10 Essential (primary) hypertension: Secondary | ICD-10-CM | POA: Diagnosis not present

## 2017-06-02 DIAGNOSIS — E786 Lipoprotein deficiency: Secondary | ICD-10-CM | POA: Diagnosis not present

## 2017-06-02 DIAGNOSIS — F17210 Nicotine dependence, cigarettes, uncomplicated: Secondary | ICD-10-CM | POA: Diagnosis not present

## 2017-06-02 DIAGNOSIS — Z299 Encounter for prophylactic measures, unspecified: Secondary | ICD-10-CM | POA: Diagnosis not present

## 2017-06-10 DIAGNOSIS — I10 Essential (primary) hypertension: Secondary | ICD-10-CM | POA: Diagnosis not present

## 2017-06-10 DIAGNOSIS — G4733 Obstructive sleep apnea (adult) (pediatric): Secondary | ICD-10-CM | POA: Diagnosis not present

## 2017-06-10 DIAGNOSIS — J449 Chronic obstructive pulmonary disease, unspecified: Secondary | ICD-10-CM | POA: Diagnosis not present

## 2017-06-10 DIAGNOSIS — Z9181 History of falling: Secondary | ICD-10-CM | POA: Diagnosis not present

## 2017-07-23 DIAGNOSIS — E1165 Type 2 diabetes mellitus with hyperglycemia: Secondary | ICD-10-CM | POA: Diagnosis not present

## 2017-07-23 DIAGNOSIS — E786 Lipoprotein deficiency: Secondary | ICD-10-CM | POA: Diagnosis not present

## 2017-07-23 DIAGNOSIS — J449 Chronic obstructive pulmonary disease, unspecified: Secondary | ICD-10-CM | POA: Diagnosis not present

## 2017-07-23 DIAGNOSIS — Z6828 Body mass index (BMI) 28.0-28.9, adult: Secondary | ICD-10-CM | POA: Diagnosis not present

## 2017-07-23 DIAGNOSIS — Z713 Dietary counseling and surveillance: Secondary | ICD-10-CM | POA: Diagnosis not present

## 2017-07-23 DIAGNOSIS — K219 Gastro-esophageal reflux disease without esophagitis: Secondary | ICD-10-CM | POA: Diagnosis not present

## 2017-07-23 DIAGNOSIS — Z299 Encounter for prophylactic measures, unspecified: Secondary | ICD-10-CM | POA: Diagnosis not present

## 2017-07-23 DIAGNOSIS — I10 Essential (primary) hypertension: Secondary | ICD-10-CM | POA: Diagnosis not present

## 2017-07-23 DIAGNOSIS — F329 Major depressive disorder, single episode, unspecified: Secondary | ICD-10-CM | POA: Diagnosis not present

## 2017-08-06 DIAGNOSIS — E1165 Type 2 diabetes mellitus with hyperglycemia: Secondary | ICD-10-CM | POA: Diagnosis not present

## 2017-08-09 DIAGNOSIS — Z6828 Body mass index (BMI) 28.0-28.9, adult: Secondary | ICD-10-CM | POA: Diagnosis not present

## 2017-08-09 DIAGNOSIS — F17200 Nicotine dependence, unspecified, uncomplicated: Secondary | ICD-10-CM | POA: Diagnosis not present

## 2017-08-09 DIAGNOSIS — E1165 Type 2 diabetes mellitus with hyperglycemia: Secondary | ICD-10-CM | POA: Diagnosis not present

## 2017-08-09 DIAGNOSIS — I2510 Atherosclerotic heart disease of native coronary artery without angina pectoris: Secondary | ICD-10-CM | POA: Diagnosis not present

## 2017-08-09 DIAGNOSIS — Z299 Encounter for prophylactic measures, unspecified: Secondary | ICD-10-CM | POA: Diagnosis not present

## 2017-08-09 DIAGNOSIS — I10 Essential (primary) hypertension: Secondary | ICD-10-CM | POA: Diagnosis not present

## 2017-08-09 DIAGNOSIS — N400 Enlarged prostate without lower urinary tract symptoms: Secondary | ICD-10-CM | POA: Diagnosis not present

## 2017-08-09 DIAGNOSIS — F329 Major depressive disorder, single episode, unspecified: Secondary | ICD-10-CM | POA: Diagnosis not present

## 2017-08-09 DIAGNOSIS — E786 Lipoprotein deficiency: Secondary | ICD-10-CM | POA: Diagnosis not present

## 2017-08-09 DIAGNOSIS — I340 Nonrheumatic mitral (valve) insufficiency: Secondary | ICD-10-CM | POA: Diagnosis not present

## 2017-08-09 DIAGNOSIS — J449 Chronic obstructive pulmonary disease, unspecified: Secondary | ICD-10-CM | POA: Diagnosis not present

## 2017-08-26 DIAGNOSIS — I2510 Atherosclerotic heart disease of native coronary artery without angina pectoris: Secondary | ICD-10-CM | POA: Diagnosis not present

## 2017-08-26 DIAGNOSIS — E786 Lipoprotein deficiency: Secondary | ICD-10-CM | POA: Diagnosis not present

## 2017-08-26 DIAGNOSIS — F17200 Nicotine dependence, unspecified, uncomplicated: Secondary | ICD-10-CM | POA: Diagnosis not present

## 2017-08-26 DIAGNOSIS — Z6828 Body mass index (BMI) 28.0-28.9, adult: Secondary | ICD-10-CM | POA: Diagnosis not present

## 2017-08-26 DIAGNOSIS — I340 Nonrheumatic mitral (valve) insufficiency: Secondary | ICD-10-CM | POA: Diagnosis not present

## 2017-08-26 DIAGNOSIS — E1165 Type 2 diabetes mellitus with hyperglycemia: Secondary | ICD-10-CM | POA: Diagnosis not present

## 2017-08-26 DIAGNOSIS — I10 Essential (primary) hypertension: Secondary | ICD-10-CM | POA: Diagnosis not present

## 2017-08-26 DIAGNOSIS — J449 Chronic obstructive pulmonary disease, unspecified: Secondary | ICD-10-CM | POA: Diagnosis not present

## 2017-08-26 DIAGNOSIS — Z299 Encounter for prophylactic measures, unspecified: Secondary | ICD-10-CM | POA: Diagnosis not present

## 2017-08-26 DIAGNOSIS — F329 Major depressive disorder, single episode, unspecified: Secondary | ICD-10-CM | POA: Diagnosis not present

## 2017-09-09 DIAGNOSIS — E113293 Type 2 diabetes mellitus with mild nonproliferative diabetic retinopathy without macular edema, bilateral: Secondary | ICD-10-CM | POA: Diagnosis not present

## 2017-09-23 DIAGNOSIS — Z6828 Body mass index (BMI) 28.0-28.9, adult: Secondary | ICD-10-CM | POA: Diagnosis not present

## 2017-09-23 DIAGNOSIS — I10 Essential (primary) hypertension: Secondary | ICD-10-CM | POA: Diagnosis not present

## 2017-09-23 DIAGNOSIS — J449 Chronic obstructive pulmonary disease, unspecified: Secondary | ICD-10-CM | POA: Diagnosis not present

## 2017-09-23 DIAGNOSIS — I340 Nonrheumatic mitral (valve) insufficiency: Secondary | ICD-10-CM | POA: Diagnosis not present

## 2017-09-23 DIAGNOSIS — F329 Major depressive disorder, single episode, unspecified: Secondary | ICD-10-CM | POA: Diagnosis not present

## 2017-09-23 DIAGNOSIS — E1165 Type 2 diabetes mellitus with hyperglycemia: Secondary | ICD-10-CM | POA: Diagnosis not present

## 2017-09-23 DIAGNOSIS — Z299 Encounter for prophylactic measures, unspecified: Secondary | ICD-10-CM | POA: Diagnosis not present

## 2017-09-23 DIAGNOSIS — E786 Lipoprotein deficiency: Secondary | ICD-10-CM | POA: Diagnosis not present

## 2017-09-23 DIAGNOSIS — F419 Anxiety disorder, unspecified: Secondary | ICD-10-CM | POA: Diagnosis not present

## 2017-09-23 DIAGNOSIS — N400 Enlarged prostate without lower urinary tract symptoms: Secondary | ICD-10-CM | POA: Diagnosis not present

## 2017-09-23 DIAGNOSIS — I2510 Atherosclerotic heart disease of native coronary artery without angina pectoris: Secondary | ICD-10-CM | POA: Diagnosis not present

## 2017-09-23 DIAGNOSIS — R079 Chest pain, unspecified: Secondary | ICD-10-CM | POA: Diagnosis not present

## 2017-10-08 DIAGNOSIS — I2510 Atherosclerotic heart disease of native coronary artery without angina pectoris: Secondary | ICD-10-CM | POA: Diagnosis not present

## 2017-10-08 DIAGNOSIS — Z23 Encounter for immunization: Secondary | ICD-10-CM | POA: Diagnosis not present

## 2017-10-08 DIAGNOSIS — Z299 Encounter for prophylactic measures, unspecified: Secondary | ICD-10-CM | POA: Diagnosis not present

## 2017-10-08 DIAGNOSIS — I10 Essential (primary) hypertension: Secondary | ICD-10-CM | POA: Diagnosis not present

## 2017-10-08 DIAGNOSIS — Z6828 Body mass index (BMI) 28.0-28.9, adult: Secondary | ICD-10-CM | POA: Diagnosis not present

## 2017-10-08 DIAGNOSIS — M549 Dorsalgia, unspecified: Secondary | ICD-10-CM | POA: Diagnosis not present

## 2017-10-08 DIAGNOSIS — J449 Chronic obstructive pulmonary disease, unspecified: Secondary | ICD-10-CM | POA: Diagnosis not present

## 2017-10-27 DIAGNOSIS — J449 Chronic obstructive pulmonary disease, unspecified: Secondary | ICD-10-CM | POA: Diagnosis not present

## 2017-10-27 DIAGNOSIS — I10 Essential (primary) hypertension: Secondary | ICD-10-CM | POA: Diagnosis not present

## 2017-10-27 DIAGNOSIS — M549 Dorsalgia, unspecified: Secondary | ICD-10-CM | POA: Diagnosis not present

## 2017-10-27 DIAGNOSIS — Z6828 Body mass index (BMI) 28.0-28.9, adult: Secondary | ICD-10-CM | POA: Diagnosis not present

## 2017-10-27 DIAGNOSIS — Z299 Encounter for prophylactic measures, unspecified: Secondary | ICD-10-CM | POA: Diagnosis not present

## 2017-11-02 DIAGNOSIS — J984 Other disorders of lung: Secondary | ICD-10-CM | POA: Diagnosis not present

## 2017-11-02 DIAGNOSIS — J309 Allergic rhinitis, unspecified: Secondary | ICD-10-CM | POA: Diagnosis not present

## 2017-11-02 DIAGNOSIS — R9389 Abnormal findings on diagnostic imaging of other specified body structures: Secondary | ICD-10-CM | POA: Diagnosis not present

## 2017-11-02 DIAGNOSIS — Z9889 Other specified postprocedural states: Secondary | ICD-10-CM | POA: Diagnosis not present

## 2017-11-02 DIAGNOSIS — Z720 Tobacco use: Secondary | ICD-10-CM | POA: Diagnosis not present

## 2017-11-02 DIAGNOSIS — R0902 Hypoxemia: Secondary | ICD-10-CM | POA: Diagnosis not present

## 2017-11-02 DIAGNOSIS — J449 Chronic obstructive pulmonary disease, unspecified: Secondary | ICD-10-CM | POA: Diagnosis not present

## 2017-11-02 DIAGNOSIS — G4733 Obstructive sleep apnea (adult) (pediatric): Secondary | ICD-10-CM | POA: Diagnosis not present

## 2017-11-03 DIAGNOSIS — M549 Dorsalgia, unspecified: Secondary | ICD-10-CM | POA: Diagnosis not present

## 2017-11-03 DIAGNOSIS — F329 Major depressive disorder, single episode, unspecified: Secondary | ICD-10-CM | POA: Diagnosis not present

## 2017-11-03 DIAGNOSIS — E1165 Type 2 diabetes mellitus with hyperglycemia: Secondary | ICD-10-CM | POA: Diagnosis not present

## 2017-11-03 DIAGNOSIS — I10 Essential (primary) hypertension: Secondary | ICD-10-CM | POA: Diagnosis not present

## 2017-11-03 DIAGNOSIS — J449 Chronic obstructive pulmonary disease, unspecified: Secondary | ICD-10-CM | POA: Diagnosis not present

## 2017-11-03 DIAGNOSIS — Z6828 Body mass index (BMI) 28.0-28.9, adult: Secondary | ICD-10-CM | POA: Diagnosis not present

## 2017-11-03 DIAGNOSIS — Z299 Encounter for prophylactic measures, unspecified: Secondary | ICD-10-CM | POA: Diagnosis not present

## 2017-11-08 DIAGNOSIS — M5126 Other intervertebral disc displacement, lumbar region: Secondary | ICD-10-CM | POA: Diagnosis not present

## 2017-11-08 DIAGNOSIS — M48061 Spinal stenosis, lumbar region without neurogenic claudication: Secondary | ICD-10-CM | POA: Diagnosis not present

## 2017-11-08 DIAGNOSIS — M545 Low back pain: Secondary | ICD-10-CM | POA: Diagnosis not present

## 2017-11-11 DIAGNOSIS — I10 Essential (primary) hypertension: Secondary | ICD-10-CM | POA: Diagnosis not present

## 2017-11-11 DIAGNOSIS — J449 Chronic obstructive pulmonary disease, unspecified: Secondary | ICD-10-CM | POA: Diagnosis not present

## 2017-11-11 DIAGNOSIS — Z6828 Body mass index (BMI) 28.0-28.9, adult: Secondary | ICD-10-CM | POA: Diagnosis not present

## 2017-11-11 DIAGNOSIS — E1165 Type 2 diabetes mellitus with hyperglycemia: Secondary | ICD-10-CM | POA: Diagnosis not present

## 2017-11-11 DIAGNOSIS — Z299 Encounter for prophylactic measures, unspecified: Secondary | ICD-10-CM | POA: Diagnosis not present

## 2017-11-11 DIAGNOSIS — M542 Cervicalgia: Secondary | ICD-10-CM | POA: Diagnosis not present

## 2017-11-11 DIAGNOSIS — M549 Dorsalgia, unspecified: Secondary | ICD-10-CM | POA: Diagnosis not present

## 2017-12-01 DIAGNOSIS — M47816 Spondylosis without myelopathy or radiculopathy, lumbar region: Secondary | ICD-10-CM | POA: Diagnosis not present

## 2017-12-01 DIAGNOSIS — M545 Low back pain: Secondary | ICD-10-CM | POA: Diagnosis not present

## 2017-12-01 DIAGNOSIS — M47812 Spondylosis without myelopathy or radiculopathy, cervical region: Secondary | ICD-10-CM | POA: Diagnosis not present

## 2017-12-01 DIAGNOSIS — M7918 Myalgia, other site: Secondary | ICD-10-CM | POA: Diagnosis not present

## 2017-12-01 DIAGNOSIS — M5136 Other intervertebral disc degeneration, lumbar region: Secondary | ICD-10-CM | POA: Diagnosis not present

## 2017-12-01 DIAGNOSIS — G8929 Other chronic pain: Secondary | ICD-10-CM | POA: Diagnosis not present

## 2017-12-08 DIAGNOSIS — M542 Cervicalgia: Secondary | ICD-10-CM | POA: Diagnosis not present

## 2017-12-08 DIAGNOSIS — M6281 Muscle weakness (generalized): Secondary | ICD-10-CM | POA: Diagnosis not present

## 2017-12-08 DIAGNOSIS — M25511 Pain in right shoulder: Secondary | ICD-10-CM | POA: Diagnosis not present

## 2017-12-08 DIAGNOSIS — M799 Soft tissue disorder, unspecified: Secondary | ICD-10-CM | POA: Diagnosis not present

## 2017-12-09 DIAGNOSIS — M50322 Other cervical disc degeneration at C5-C6 level: Secondary | ICD-10-CM | POA: Diagnosis not present

## 2017-12-09 DIAGNOSIS — M50323 Other cervical disc degeneration at C6-C7 level: Secondary | ICD-10-CM | POA: Diagnosis not present

## 2017-12-09 DIAGNOSIS — M47892 Other spondylosis, cervical region: Secondary | ICD-10-CM | POA: Diagnosis not present

## 2017-12-09 DIAGNOSIS — M5136 Other intervertebral disc degeneration, lumbar region: Secondary | ICD-10-CM | POA: Diagnosis not present

## 2017-12-10 DIAGNOSIS — M6281 Muscle weakness (generalized): Secondary | ICD-10-CM | POA: Diagnosis not present

## 2017-12-10 DIAGNOSIS — M25511 Pain in right shoulder: Secondary | ICD-10-CM | POA: Diagnosis not present

## 2017-12-10 DIAGNOSIS — M542 Cervicalgia: Secondary | ICD-10-CM | POA: Diagnosis not present

## 2017-12-10 DIAGNOSIS — M799 Soft tissue disorder, unspecified: Secondary | ICD-10-CM | POA: Diagnosis not present

## 2017-12-13 DIAGNOSIS — M542 Cervicalgia: Secondary | ICD-10-CM | POA: Diagnosis not present

## 2017-12-13 DIAGNOSIS — M6281 Muscle weakness (generalized): Secondary | ICD-10-CM | POA: Diagnosis not present

## 2017-12-13 DIAGNOSIS — M25511 Pain in right shoulder: Secondary | ICD-10-CM | POA: Diagnosis not present

## 2017-12-13 DIAGNOSIS — M799 Soft tissue disorder, unspecified: Secondary | ICD-10-CM | POA: Diagnosis not present

## 2017-12-15 DIAGNOSIS — M799 Soft tissue disorder, unspecified: Secondary | ICD-10-CM | POA: Diagnosis not present

## 2017-12-15 DIAGNOSIS — M6281 Muscle weakness (generalized): Secondary | ICD-10-CM | POA: Diagnosis not present

## 2017-12-15 DIAGNOSIS — M25511 Pain in right shoulder: Secondary | ICD-10-CM | POA: Diagnosis not present

## 2017-12-15 DIAGNOSIS — M542 Cervicalgia: Secondary | ICD-10-CM | POA: Diagnosis not present

## 2017-12-17 DIAGNOSIS — M799 Soft tissue disorder, unspecified: Secondary | ICD-10-CM | POA: Diagnosis not present

## 2017-12-17 DIAGNOSIS — M6281 Muscle weakness (generalized): Secondary | ICD-10-CM | POA: Diagnosis not present

## 2017-12-17 DIAGNOSIS — M25511 Pain in right shoulder: Secondary | ICD-10-CM | POA: Diagnosis not present

## 2017-12-17 DIAGNOSIS — M542 Cervicalgia: Secondary | ICD-10-CM | POA: Diagnosis not present

## 2017-12-24 DIAGNOSIS — M6281 Muscle weakness (generalized): Secondary | ICD-10-CM | POA: Diagnosis not present

## 2017-12-24 DIAGNOSIS — M25511 Pain in right shoulder: Secondary | ICD-10-CM | POA: Diagnosis not present

## 2017-12-24 DIAGNOSIS — M799 Soft tissue disorder, unspecified: Secondary | ICD-10-CM | POA: Diagnosis not present

## 2017-12-24 DIAGNOSIS — M542 Cervicalgia: Secondary | ICD-10-CM | POA: Diagnosis not present

## 2017-12-27 DIAGNOSIS — M25511 Pain in right shoulder: Secondary | ICD-10-CM | POA: Diagnosis not present

## 2017-12-27 DIAGNOSIS — M6281 Muscle weakness (generalized): Secondary | ICD-10-CM | POA: Diagnosis not present

## 2017-12-27 DIAGNOSIS — M542 Cervicalgia: Secondary | ICD-10-CM | POA: Diagnosis not present

## 2017-12-27 DIAGNOSIS — M799 Soft tissue disorder, unspecified: Secondary | ICD-10-CM | POA: Diagnosis not present

## 2017-12-29 DIAGNOSIS — M6281 Muscle weakness (generalized): Secondary | ICD-10-CM | POA: Diagnosis not present

## 2017-12-29 DIAGNOSIS — M25511 Pain in right shoulder: Secondary | ICD-10-CM | POA: Diagnosis not present

## 2017-12-29 DIAGNOSIS — M542 Cervicalgia: Secondary | ICD-10-CM | POA: Diagnosis not present

## 2017-12-29 DIAGNOSIS — M799 Soft tissue disorder, unspecified: Secondary | ICD-10-CM | POA: Diagnosis not present

## 2017-12-31 DIAGNOSIS — M6281 Muscle weakness (generalized): Secondary | ICD-10-CM | POA: Diagnosis not present

## 2017-12-31 DIAGNOSIS — M25511 Pain in right shoulder: Secondary | ICD-10-CM | POA: Diagnosis not present

## 2017-12-31 DIAGNOSIS — M542 Cervicalgia: Secondary | ICD-10-CM | POA: Diagnosis not present

## 2017-12-31 DIAGNOSIS — M799 Soft tissue disorder, unspecified: Secondary | ICD-10-CM | POA: Diagnosis not present

## 2018-01-03 DIAGNOSIS — M5030 Other cervical disc degeneration, unspecified cervical region: Secondary | ICD-10-CM | POA: Diagnosis not present

## 2018-01-03 DIAGNOSIS — M5136 Other intervertebral disc degeneration, lumbar region: Secondary | ICD-10-CM | POA: Diagnosis not present

## 2018-01-03 DIAGNOSIS — G8929 Other chronic pain: Secondary | ICD-10-CM | POA: Diagnosis not present

## 2018-01-03 DIAGNOSIS — M545 Low back pain: Secondary | ICD-10-CM | POA: Diagnosis not present

## 2018-01-03 DIAGNOSIS — M47816 Spondylosis without myelopathy or radiculopathy, lumbar region: Secondary | ICD-10-CM | POA: Diagnosis not present

## 2018-01-03 DIAGNOSIS — M7918 Myalgia, other site: Secondary | ICD-10-CM | POA: Diagnosis not present

## 2018-01-03 DIAGNOSIS — M47812 Spondylosis without myelopathy or radiculopathy, cervical region: Secondary | ICD-10-CM | POA: Diagnosis not present

## 2018-01-07 DIAGNOSIS — M25511 Pain in right shoulder: Secondary | ICD-10-CM | POA: Diagnosis not present

## 2018-01-07 DIAGNOSIS — M542 Cervicalgia: Secondary | ICD-10-CM | POA: Diagnosis not present

## 2018-01-07 DIAGNOSIS — M6281 Muscle weakness (generalized): Secondary | ICD-10-CM | POA: Diagnosis not present

## 2018-01-07 DIAGNOSIS — M799 Soft tissue disorder, unspecified: Secondary | ICD-10-CM | POA: Diagnosis not present

## 2018-01-10 DIAGNOSIS — M542 Cervicalgia: Secondary | ICD-10-CM | POA: Diagnosis not present

## 2018-01-10 DIAGNOSIS — M799 Soft tissue disorder, unspecified: Secondary | ICD-10-CM | POA: Diagnosis not present

## 2018-01-10 DIAGNOSIS — M6281 Muscle weakness (generalized): Secondary | ICD-10-CM | POA: Diagnosis not present

## 2018-01-10 DIAGNOSIS — M25511 Pain in right shoulder: Secondary | ICD-10-CM | POA: Diagnosis not present

## 2018-01-12 DIAGNOSIS — M542 Cervicalgia: Secondary | ICD-10-CM | POA: Diagnosis not present

## 2018-01-12 DIAGNOSIS — M25511 Pain in right shoulder: Secondary | ICD-10-CM | POA: Diagnosis not present

## 2018-01-12 DIAGNOSIS — M6281 Muscle weakness (generalized): Secondary | ICD-10-CM | POA: Diagnosis not present

## 2018-01-12 DIAGNOSIS — M799 Soft tissue disorder, unspecified: Secondary | ICD-10-CM | POA: Diagnosis not present

## 2018-01-19 DIAGNOSIS — M799 Soft tissue disorder, unspecified: Secondary | ICD-10-CM | POA: Diagnosis not present

## 2018-01-19 DIAGNOSIS — M6281 Muscle weakness (generalized): Secondary | ICD-10-CM | POA: Diagnosis not present

## 2018-01-19 DIAGNOSIS — M25511 Pain in right shoulder: Secondary | ICD-10-CM | POA: Diagnosis not present

## 2018-01-19 DIAGNOSIS — M542 Cervicalgia: Secondary | ICD-10-CM | POA: Diagnosis not present

## 2018-01-20 DIAGNOSIS — M47816 Spondylosis without myelopathy or radiculopathy, lumbar region: Secondary | ICD-10-CM | POA: Diagnosis not present

## 2018-01-20 DIAGNOSIS — M47812 Spondylosis without myelopathy or radiculopathy, cervical region: Secondary | ICD-10-CM | POA: Diagnosis not present

## 2018-01-20 DIAGNOSIS — M5030 Other cervical disc degeneration, unspecified cervical region: Secondary | ICD-10-CM | POA: Diagnosis not present

## 2018-01-21 DIAGNOSIS — M799 Soft tissue disorder, unspecified: Secondary | ICD-10-CM | POA: Diagnosis not present

## 2018-01-21 DIAGNOSIS — M25511 Pain in right shoulder: Secondary | ICD-10-CM | POA: Diagnosis not present

## 2018-01-21 DIAGNOSIS — M6281 Muscle weakness (generalized): Secondary | ICD-10-CM | POA: Diagnosis not present

## 2018-01-21 DIAGNOSIS — M542 Cervicalgia: Secondary | ICD-10-CM | POA: Diagnosis not present

## 2018-01-24 DIAGNOSIS — M6281 Muscle weakness (generalized): Secondary | ICD-10-CM | POA: Diagnosis not present

## 2018-01-24 DIAGNOSIS — M799 Soft tissue disorder, unspecified: Secondary | ICD-10-CM | POA: Diagnosis not present

## 2018-01-24 DIAGNOSIS — M25511 Pain in right shoulder: Secondary | ICD-10-CM | POA: Diagnosis not present

## 2018-01-24 DIAGNOSIS — M542 Cervicalgia: Secondary | ICD-10-CM | POA: Diagnosis not present

## 2018-01-27 DIAGNOSIS — M542 Cervicalgia: Secondary | ICD-10-CM | POA: Diagnosis not present

## 2018-01-27 DIAGNOSIS — M6281 Muscle weakness (generalized): Secondary | ICD-10-CM | POA: Diagnosis not present

## 2018-01-27 DIAGNOSIS — M25511 Pain in right shoulder: Secondary | ICD-10-CM | POA: Diagnosis not present

## 2018-01-27 DIAGNOSIS — M799 Soft tissue disorder, unspecified: Secondary | ICD-10-CM | POA: Diagnosis not present

## 2018-01-28 DIAGNOSIS — M542 Cervicalgia: Secondary | ICD-10-CM | POA: Diagnosis not present

## 2018-01-28 DIAGNOSIS — M6281 Muscle weakness (generalized): Secondary | ICD-10-CM | POA: Diagnosis not present

## 2018-01-28 DIAGNOSIS — M799 Soft tissue disorder, unspecified: Secondary | ICD-10-CM | POA: Diagnosis not present

## 2018-01-28 DIAGNOSIS — M25511 Pain in right shoulder: Secondary | ICD-10-CM | POA: Diagnosis not present

## 2018-02-01 DIAGNOSIS — M25511 Pain in right shoulder: Secondary | ICD-10-CM | POA: Diagnosis not present

## 2018-02-01 DIAGNOSIS — M542 Cervicalgia: Secondary | ICD-10-CM | POA: Diagnosis not present

## 2018-02-01 DIAGNOSIS — M799 Soft tissue disorder, unspecified: Secondary | ICD-10-CM | POA: Diagnosis not present

## 2018-02-01 DIAGNOSIS — M6281 Muscle weakness (generalized): Secondary | ICD-10-CM | POA: Diagnosis not present

## 2018-02-04 DIAGNOSIS — M6281 Muscle weakness (generalized): Secondary | ICD-10-CM | POA: Diagnosis not present

## 2018-02-04 DIAGNOSIS — M799 Soft tissue disorder, unspecified: Secondary | ICD-10-CM | POA: Diagnosis not present

## 2018-02-04 DIAGNOSIS — M25511 Pain in right shoulder: Secondary | ICD-10-CM | POA: Diagnosis not present

## 2018-02-04 DIAGNOSIS — M542 Cervicalgia: Secondary | ICD-10-CM | POA: Diagnosis not present

## 2018-02-18 DIAGNOSIS — M5030 Other cervical disc degeneration, unspecified cervical region: Secondary | ICD-10-CM | POA: Diagnosis not present

## 2018-02-18 DIAGNOSIS — M47812 Spondylosis without myelopathy or radiculopathy, cervical region: Secondary | ICD-10-CM | POA: Diagnosis not present

## 2018-02-18 DIAGNOSIS — G894 Chronic pain syndrome: Secondary | ICD-10-CM | POA: Diagnosis not present

## 2018-03-25 DIAGNOSIS — M47812 Spondylosis without myelopathy or radiculopathy, cervical region: Secondary | ICD-10-CM | POA: Diagnosis not present

## 2018-05-02 DIAGNOSIS — J449 Chronic obstructive pulmonary disease, unspecified: Secondary | ICD-10-CM | POA: Diagnosis not present

## 2018-05-02 DIAGNOSIS — M542 Cervicalgia: Secondary | ICD-10-CM | POA: Diagnosis not present

## 2018-05-02 DIAGNOSIS — I10 Essential (primary) hypertension: Secondary | ICD-10-CM | POA: Diagnosis not present

## 2018-05-02 DIAGNOSIS — Z6828 Body mass index (BMI) 28.0-28.9, adult: Secondary | ICD-10-CM | POA: Diagnosis not present

## 2018-05-02 DIAGNOSIS — Z9119 Patient's noncompliance with other medical treatment and regimen: Secondary | ICD-10-CM | POA: Diagnosis not present

## 2018-05-02 DIAGNOSIS — F17210 Nicotine dependence, cigarettes, uncomplicated: Secondary | ICD-10-CM | POA: Diagnosis not present

## 2018-05-02 DIAGNOSIS — Z299 Encounter for prophylactic measures, unspecified: Secondary | ICD-10-CM | POA: Diagnosis not present

## 2018-05-02 DIAGNOSIS — E1165 Type 2 diabetes mellitus with hyperglycemia: Secondary | ICD-10-CM | POA: Diagnosis not present

## 2018-05-02 DIAGNOSIS — B354 Tinea corporis: Secondary | ICD-10-CM | POA: Diagnosis not present

## 2018-05-03 DIAGNOSIS — R0902 Hypoxemia: Secondary | ICD-10-CM | POA: Diagnosis not present

## 2018-05-03 DIAGNOSIS — R9389 Abnormal findings on diagnostic imaging of other specified body structures: Secondary | ICD-10-CM | POA: Diagnosis not present

## 2018-05-03 DIAGNOSIS — R942 Abnormal results of pulmonary function studies: Secondary | ICD-10-CM | POA: Diagnosis not present

## 2018-05-03 DIAGNOSIS — J449 Chronic obstructive pulmonary disease, unspecified: Secondary | ICD-10-CM | POA: Diagnosis not present

## 2018-05-03 DIAGNOSIS — Z720 Tobacco use: Secondary | ICD-10-CM | POA: Diagnosis not present

## 2018-05-03 DIAGNOSIS — G4733 Obstructive sleep apnea (adult) (pediatric): Secondary | ICD-10-CM | POA: Diagnosis not present

## 2018-05-03 DIAGNOSIS — J984 Other disorders of lung: Secondary | ICD-10-CM | POA: Diagnosis not present

## 2018-05-13 DIAGNOSIS — M542 Cervicalgia: Secondary | ICD-10-CM | POA: Diagnosis not present

## 2018-05-13 DIAGNOSIS — I10 Essential (primary) hypertension: Secondary | ICD-10-CM | POA: Diagnosis not present

## 2018-05-13 DIAGNOSIS — J069 Acute upper respiratory infection, unspecified: Secondary | ICD-10-CM | POA: Diagnosis not present

## 2018-05-13 DIAGNOSIS — Z6828 Body mass index (BMI) 28.0-28.9, adult: Secondary | ICD-10-CM | POA: Diagnosis not present

## 2018-05-13 DIAGNOSIS — E1165 Type 2 diabetes mellitus with hyperglycemia: Secondary | ICD-10-CM | POA: Diagnosis not present

## 2018-05-13 DIAGNOSIS — F17210 Nicotine dependence, cigarettes, uncomplicated: Secondary | ICD-10-CM | POA: Diagnosis not present

## 2018-05-13 DIAGNOSIS — Z299 Encounter for prophylactic measures, unspecified: Secondary | ICD-10-CM | POA: Diagnosis not present

## 2018-06-06 DIAGNOSIS — I2510 Atherosclerotic heart disease of native coronary artery without angina pectoris: Secondary | ICD-10-CM | POA: Diagnosis not present

## 2018-06-06 DIAGNOSIS — J449 Chronic obstructive pulmonary disease, unspecified: Secondary | ICD-10-CM | POA: Diagnosis not present

## 2018-06-06 DIAGNOSIS — Z6828 Body mass index (BMI) 28.0-28.9, adult: Secondary | ICD-10-CM | POA: Diagnosis not present

## 2018-06-06 DIAGNOSIS — I10 Essential (primary) hypertension: Secondary | ICD-10-CM | POA: Diagnosis not present

## 2018-06-06 DIAGNOSIS — E1165 Type 2 diabetes mellitus with hyperglycemia: Secondary | ICD-10-CM | POA: Diagnosis not present

## 2018-06-06 DIAGNOSIS — Z299 Encounter for prophylactic measures, unspecified: Secondary | ICD-10-CM | POA: Diagnosis not present

## 2018-06-06 DIAGNOSIS — I340 Nonrheumatic mitral (valve) insufficiency: Secondary | ICD-10-CM | POA: Diagnosis not present

## 2018-06-10 DIAGNOSIS — G4733 Obstructive sleep apnea (adult) (pediatric): Secondary | ICD-10-CM | POA: Diagnosis not present

## 2018-06-10 DIAGNOSIS — R0689 Other abnormalities of breathing: Secondary | ICD-10-CM | POA: Diagnosis not present

## 2018-06-10 DIAGNOSIS — Z720 Tobacco use: Secondary | ICD-10-CM | POA: Diagnosis not present

## 2018-06-10 DIAGNOSIS — Z9181 History of falling: Secondary | ICD-10-CM | POA: Diagnosis not present

## 2018-07-27 DIAGNOSIS — R21 Rash and other nonspecific skin eruption: Secondary | ICD-10-CM | POA: Diagnosis not present

## 2018-07-27 DIAGNOSIS — Z299 Encounter for prophylactic measures, unspecified: Secondary | ICD-10-CM | POA: Diagnosis not present

## 2018-07-27 DIAGNOSIS — Z713 Dietary counseling and surveillance: Secondary | ICD-10-CM | POA: Diagnosis not present

## 2018-07-27 DIAGNOSIS — J449 Chronic obstructive pulmonary disease, unspecified: Secondary | ICD-10-CM | POA: Diagnosis not present

## 2018-07-27 DIAGNOSIS — I10 Essential (primary) hypertension: Secondary | ICD-10-CM | POA: Diagnosis not present

## 2018-08-08 DIAGNOSIS — Z1331 Encounter for screening for depression: Secondary | ICD-10-CM | POA: Diagnosis not present

## 2018-08-08 DIAGNOSIS — Z1339 Encounter for screening examination for other mental health and behavioral disorders: Secondary | ICD-10-CM | POA: Diagnosis not present

## 2018-08-08 DIAGNOSIS — Z7189 Other specified counseling: Secondary | ICD-10-CM | POA: Diagnosis not present

## 2018-08-08 DIAGNOSIS — R5383 Other fatigue: Secondary | ICD-10-CM | POA: Diagnosis not present

## 2018-08-08 DIAGNOSIS — Z299 Encounter for prophylactic measures, unspecified: Secondary | ICD-10-CM | POA: Diagnosis not present

## 2018-08-08 DIAGNOSIS — I10 Essential (primary) hypertension: Secondary | ICD-10-CM | POA: Diagnosis not present

## 2018-08-08 DIAGNOSIS — Z6829 Body mass index (BMI) 29.0-29.9, adult: Secondary | ICD-10-CM | POA: Diagnosis not present

## 2018-08-08 DIAGNOSIS — F419 Anxiety disorder, unspecified: Secondary | ICD-10-CM | POA: Diagnosis not present

## 2018-08-08 DIAGNOSIS — E786 Lipoprotein deficiency: Secondary | ICD-10-CM | POA: Diagnosis not present

## 2018-08-08 DIAGNOSIS — E1165 Type 2 diabetes mellitus with hyperglycemia: Secondary | ICD-10-CM | POA: Diagnosis not present

## 2018-08-08 DIAGNOSIS — Z0000 Encounter for general adult medical examination without abnormal findings: Secondary | ICD-10-CM | POA: Diagnosis not present

## 2018-08-10 DIAGNOSIS — Z79899 Other long term (current) drug therapy: Secondary | ICD-10-CM | POA: Diagnosis not present

## 2018-08-10 DIAGNOSIS — F419 Anxiety disorder, unspecified: Secondary | ICD-10-CM | POA: Diagnosis not present

## 2018-08-10 DIAGNOSIS — E786 Lipoprotein deficiency: Secondary | ICD-10-CM | POA: Diagnosis not present

## 2018-08-10 DIAGNOSIS — R5383 Other fatigue: Secondary | ICD-10-CM | POA: Diagnosis not present

## 2018-08-10 DIAGNOSIS — Z125 Encounter for screening for malignant neoplasm of prostate: Secondary | ICD-10-CM | POA: Diagnosis not present

## 2018-10-07 DIAGNOSIS — Z23 Encounter for immunization: Secondary | ICD-10-CM | POA: Diagnosis not present

## 2018-11-22 DIAGNOSIS — F17210 Nicotine dependence, cigarettes, uncomplicated: Secondary | ICD-10-CM | POA: Diagnosis not present

## 2018-11-22 DIAGNOSIS — Z299 Encounter for prophylactic measures, unspecified: Secondary | ICD-10-CM | POA: Diagnosis not present

## 2018-11-22 DIAGNOSIS — Z6830 Body mass index (BMI) 30.0-30.9, adult: Secondary | ICD-10-CM | POA: Diagnosis not present

## 2018-11-22 DIAGNOSIS — I10 Essential (primary) hypertension: Secondary | ICD-10-CM | POA: Diagnosis not present

## 2018-11-22 DIAGNOSIS — R05 Cough: Secondary | ICD-10-CM | POA: Diagnosis not present

## 2018-11-22 DIAGNOSIS — E1165 Type 2 diabetes mellitus with hyperglycemia: Secondary | ICD-10-CM | POA: Diagnosis not present

## 2018-11-22 DIAGNOSIS — J449 Chronic obstructive pulmonary disease, unspecified: Secondary | ICD-10-CM | POA: Diagnosis not present

## 2018-12-14 DIAGNOSIS — J449 Chronic obstructive pulmonary disease, unspecified: Secondary | ICD-10-CM | POA: Diagnosis not present

## 2018-12-14 DIAGNOSIS — G4733 Obstructive sleep apnea (adult) (pediatric): Secondary | ICD-10-CM | POA: Diagnosis not present

## 2018-12-14 DIAGNOSIS — R0902 Hypoxemia: Secondary | ICD-10-CM | POA: Diagnosis not present

## 2018-12-14 DIAGNOSIS — J984 Other disorders of lung: Secondary | ICD-10-CM | POA: Diagnosis not present

## 2019-05-04 DIAGNOSIS — J449 Chronic obstructive pulmonary disease, unspecified: Secondary | ICD-10-CM | POA: Diagnosis not present

## 2019-05-04 DIAGNOSIS — Z6830 Body mass index (BMI) 30.0-30.9, adult: Secondary | ICD-10-CM | POA: Diagnosis not present

## 2019-05-04 DIAGNOSIS — I10 Essential (primary) hypertension: Secondary | ICD-10-CM | POA: Diagnosis not present

## 2019-05-04 DIAGNOSIS — E1165 Type 2 diabetes mellitus with hyperglycemia: Secondary | ICD-10-CM | POA: Diagnosis not present

## 2019-05-04 DIAGNOSIS — J069 Acute upper respiratory infection, unspecified: Secondary | ICD-10-CM | POA: Diagnosis not present

## 2019-05-04 DIAGNOSIS — Z299 Encounter for prophylactic measures, unspecified: Secondary | ICD-10-CM | POA: Diagnosis not present

## 2019-06-14 DIAGNOSIS — G4733 Obstructive sleep apnea (adult) (pediatric): Secondary | ICD-10-CM | POA: Diagnosis not present

## 2019-06-14 DIAGNOSIS — J449 Chronic obstructive pulmonary disease, unspecified: Secondary | ICD-10-CM | POA: Diagnosis not present

## 2019-06-14 DIAGNOSIS — R0902 Hypoxemia: Secondary | ICD-10-CM | POA: Diagnosis not present

## 2019-06-14 DIAGNOSIS — J984 Other disorders of lung: Secondary | ICD-10-CM | POA: Diagnosis not present

## 2019-06-19 DIAGNOSIS — R0689 Other abnormalities of breathing: Secondary | ICD-10-CM | POA: Diagnosis not present

## 2019-06-19 DIAGNOSIS — Z9181 History of falling: Secondary | ICD-10-CM | POA: Diagnosis not present

## 2019-06-19 DIAGNOSIS — G4733 Obstructive sleep apnea (adult) (pediatric): Secondary | ICD-10-CM | POA: Diagnosis not present

## 2019-07-27 DIAGNOSIS — M9901 Segmental and somatic dysfunction of cervical region: Secondary | ICD-10-CM | POA: Diagnosis not present

## 2019-07-27 DIAGNOSIS — M9903 Segmental and somatic dysfunction of lumbar region: Secondary | ICD-10-CM | POA: Diagnosis not present

## 2019-07-27 DIAGNOSIS — M47812 Spondylosis without myelopathy or radiculopathy, cervical region: Secondary | ICD-10-CM | POA: Diagnosis not present

## 2019-07-27 DIAGNOSIS — M9902 Segmental and somatic dysfunction of thoracic region: Secondary | ICD-10-CM | POA: Diagnosis not present

## 2019-07-27 DIAGNOSIS — M546 Pain in thoracic spine: Secondary | ICD-10-CM | POA: Diagnosis not present

## 2019-07-27 DIAGNOSIS — M47816 Spondylosis without myelopathy or radiculopathy, lumbar region: Secondary | ICD-10-CM | POA: Diagnosis not present

## 2019-07-31 DIAGNOSIS — M47816 Spondylosis without myelopathy or radiculopathy, lumbar region: Secondary | ICD-10-CM | POA: Diagnosis not present

## 2019-07-31 DIAGNOSIS — M9903 Segmental and somatic dysfunction of lumbar region: Secondary | ICD-10-CM | POA: Diagnosis not present

## 2019-07-31 DIAGNOSIS — M9902 Segmental and somatic dysfunction of thoracic region: Secondary | ICD-10-CM | POA: Diagnosis not present

## 2019-07-31 DIAGNOSIS — M47812 Spondylosis without myelopathy or radiculopathy, cervical region: Secondary | ICD-10-CM | POA: Diagnosis not present

## 2019-07-31 DIAGNOSIS — M9901 Segmental and somatic dysfunction of cervical region: Secondary | ICD-10-CM | POA: Diagnosis not present

## 2019-07-31 DIAGNOSIS — M546 Pain in thoracic spine: Secondary | ICD-10-CM | POA: Diagnosis not present

## 2019-08-11 DIAGNOSIS — E1165 Type 2 diabetes mellitus with hyperglycemia: Secondary | ICD-10-CM | POA: Diagnosis not present

## 2019-08-11 DIAGNOSIS — I10 Essential (primary) hypertension: Secondary | ICD-10-CM | POA: Diagnosis not present

## 2019-08-11 DIAGNOSIS — Z299 Encounter for prophylactic measures, unspecified: Secondary | ICD-10-CM | POA: Diagnosis not present

## 2019-08-11 DIAGNOSIS — R609 Edema, unspecified: Secondary | ICD-10-CM | POA: Diagnosis not present

## 2019-08-11 DIAGNOSIS — F17210 Nicotine dependence, cigarettes, uncomplicated: Secondary | ICD-10-CM | POA: Diagnosis not present

## 2019-08-11 DIAGNOSIS — M542 Cervicalgia: Secondary | ICD-10-CM | POA: Diagnosis not present

## 2019-08-15 DIAGNOSIS — Z0000 Encounter for general adult medical examination without abnormal findings: Secondary | ICD-10-CM | POA: Diagnosis not present

## 2019-08-15 DIAGNOSIS — Z1331 Encounter for screening for depression: Secondary | ICD-10-CM | POA: Diagnosis not present

## 2019-08-15 DIAGNOSIS — Z7189 Other specified counseling: Secondary | ICD-10-CM | POA: Diagnosis not present

## 2019-08-15 DIAGNOSIS — Z79899 Other long term (current) drug therapy: Secondary | ICD-10-CM | POA: Diagnosis not present

## 2019-08-15 DIAGNOSIS — E786 Lipoprotein deficiency: Secondary | ICD-10-CM | POA: Diagnosis not present

## 2019-08-15 DIAGNOSIS — Z299 Encounter for prophylactic measures, unspecified: Secondary | ICD-10-CM | POA: Diagnosis not present

## 2019-08-15 DIAGNOSIS — Z125 Encounter for screening for malignant neoplasm of prostate: Secondary | ICD-10-CM | POA: Diagnosis not present

## 2019-08-15 DIAGNOSIS — Z1339 Encounter for screening examination for other mental health and behavioral disorders: Secondary | ICD-10-CM | POA: Diagnosis not present

## 2019-08-15 DIAGNOSIS — M542 Cervicalgia: Secondary | ICD-10-CM | POA: Diagnosis not present

## 2019-08-15 DIAGNOSIS — R5383 Other fatigue: Secondary | ICD-10-CM | POA: Diagnosis not present

## 2019-08-15 DIAGNOSIS — Z1211 Encounter for screening for malignant neoplasm of colon: Secondary | ICD-10-CM | POA: Diagnosis not present

## 2019-08-15 DIAGNOSIS — Z6830 Body mass index (BMI) 30.0-30.9, adult: Secondary | ICD-10-CM | POA: Diagnosis not present

## 2019-08-18 DIAGNOSIS — I10 Essential (primary) hypertension: Secondary | ICD-10-CM | POA: Diagnosis not present

## 2019-08-18 DIAGNOSIS — E1165 Type 2 diabetes mellitus with hyperglycemia: Secondary | ICD-10-CM | POA: Diagnosis not present

## 2019-08-18 DIAGNOSIS — Z6830 Body mass index (BMI) 30.0-30.9, adult: Secondary | ICD-10-CM | POA: Diagnosis not present

## 2019-08-18 DIAGNOSIS — M542 Cervicalgia: Secondary | ICD-10-CM | POA: Diagnosis not present

## 2019-08-18 DIAGNOSIS — Z299 Encounter for prophylactic measures, unspecified: Secondary | ICD-10-CM | POA: Diagnosis not present

## 2019-08-18 DIAGNOSIS — M545 Low back pain: Secondary | ICD-10-CM | POA: Diagnosis not present

## 2019-08-28 DIAGNOSIS — M47812 Spondylosis without myelopathy or radiculopathy, cervical region: Secondary | ICD-10-CM | POA: Diagnosis not present

## 2019-08-28 DIAGNOSIS — M5136 Other intervertebral disc degeneration, lumbar region: Secondary | ICD-10-CM | POA: Diagnosis not present

## 2019-08-28 DIAGNOSIS — M545 Low back pain: Secondary | ICD-10-CM | POA: Diagnosis not present

## 2019-08-28 DIAGNOSIS — M47816 Spondylosis without myelopathy or radiculopathy, lumbar region: Secondary | ICD-10-CM | POA: Diagnosis not present

## 2019-08-28 DIAGNOSIS — M542 Cervicalgia: Secondary | ICD-10-CM | POA: Diagnosis not present

## 2019-08-28 DIAGNOSIS — M5030 Other cervical disc degeneration, unspecified cervical region: Secondary | ICD-10-CM | POA: Diagnosis not present

## 2019-09-05 DIAGNOSIS — M4722 Other spondylosis with radiculopathy, cervical region: Secondary | ICD-10-CM | POA: Diagnosis not present

## 2019-09-05 DIAGNOSIS — M542 Cervicalgia: Secondary | ICD-10-CM | POA: Diagnosis not present

## 2019-09-05 DIAGNOSIS — M545 Low back pain: Secondary | ICD-10-CM | POA: Diagnosis not present

## 2019-09-14 ENCOUNTER — Telehealth: Payer: Self-pay | Admitting: *Deleted

## 2019-09-14 NOTE — Telephone Encounter (Signed)
   [facility] Pre-operative Risk Assessment    Request for surgical clearance:  1. What type of surgery is being performed?  CERVICAL FUSION  2. When is this surgery scheduled?  PENDING  3. What type of clearance is required (medical clearance vs. Pharmacy clearance to hold med vs. Both)? MEDICAL  4. Are there any medications that need to be held prior to surgery and how long? ASA  5. Practice name and name of physician performing surgery? [facility] [place]  6. What is your office phone number [phone]   7.   What is your office fax number [phone]  8.   Anesthesia type (None, local, MAC, general) ? GENERAL   Devra Dopp 09/14/2019, 11:25 AM  _________________________________________________________________   (provider comments below)

## 2019-09-15 NOTE — Telephone Encounter (Signed)
   Primary Cardiologist:No primary care provider on file.  Chart reviewed as part of pre-operative protocol coverage. Because of Warren Lee's past medical history and time since last visit, he/she will require a follow-up visit in order to better assess preoperative cardiovascular risk. He has not been seen in greater than 5 years, therefore will need a new patient appointment prior to procedure and clearance.   Pre-op covering staff: - Please schedule appointment and call patient to inform them. - Please contact requesting surgeon's office via preferred method (i.e, phone, fax) to inform them of need for appointment prior to surgery.  If applicable, this message will also be routed to pharmacy pool and/or primary cardiologist for input on holding anticoagulant/antiplatelet agent as requested below so that this information is available at time of patient's appointment.   Kathyrn Drown, NP  09/15/2019, 8:54 AM

## 2019-09-15 NOTE — Telephone Encounter (Signed)
I will route surgery information to Dr. Harrell Gave for appt 09/19/19 for surgery clearance.

## 2019-09-15 NOTE — Telephone Encounter (Addendum)
-----   Message from Michae Kava, [place] sent at 09/15/2019  9:05 AM EDT ----- Regarding: SEE PREVIOUS MESSAGE I s/w pt to let him know he will need a New Pt appt. Pt states he was told he could see Dr. Claiborne Billings in our [place] office. Please reach out the pt with appt in the [facility] office with Dr. Claiborne Billings. I appreciate all of your help in this matter.   Thank you  Julaine Hua, Erick Blinks, [place] Scheduling        Hi Scheduling team !!   Per pre op provider today this pt will need an appt. Pt last seen by Dr. Claiborne Billings at [facility] office 2014. Pt will need a new pt appt. for surgery clearance. Please reach to pt with appt with Dr. Claiborne Billings or his Care Team. I appreciate your help in this matter.   Thank you  Arbie Cookey

## 2019-09-15 NOTE — Telephone Encounter (Addendum)
° °  Patient scheduled to see Dr Harrell Gave on 9/22. Dr Claiborne Billings does not have appointments in [place]. Offered patient several locations due to upcoming surgery needing to be scheduled

## 2019-09-19 ENCOUNTER — Encounter: Payer: Self-pay | Admitting: Cardiology

## 2019-09-19 ENCOUNTER — Other Ambulatory Visit: Payer: Self-pay

## 2019-09-19 ENCOUNTER — Ambulatory Visit (INDEPENDENT_AMBULATORY_CARE_PROVIDER_SITE_OTHER): Payer: Medicare Other | Admitting: Cardiology

## 2019-09-19 VITALS — BP 158/71 | HR 88 | Temp 97.1°F | Ht 71.0 in | Wt 206.8 lb

## 2019-09-19 DIAGNOSIS — R0609 Other forms of dyspnea: Secondary | ICD-10-CM | POA: Diagnosis not present

## 2019-09-19 DIAGNOSIS — Z716 Tobacco abuse counseling: Secondary | ICD-10-CM | POA: Diagnosis not present

## 2019-09-19 DIAGNOSIS — Z01818 Encounter for other preprocedural examination: Secondary | ICD-10-CM

## 2019-09-19 DIAGNOSIS — I25119 Atherosclerotic heart disease of native coronary artery with unspecified angina pectoris: Secondary | ICD-10-CM | POA: Diagnosis not present

## 2019-09-19 DIAGNOSIS — Z720 Tobacco use: Secondary | ICD-10-CM | POA: Diagnosis not present

## 2019-09-19 DIAGNOSIS — E119 Type 2 diabetes mellitus without complications: Secondary | ICD-10-CM

## 2019-09-19 DIAGNOSIS — F17210 Nicotine dependence, cigarettes, uncomplicated: Secondary | ICD-10-CM

## 2019-09-19 DIAGNOSIS — E785 Hyperlipidemia, unspecified: Secondary | ICD-10-CM | POA: Diagnosis not present

## 2019-09-19 MED ORDER — ASPIRIN EC 81 MG PO TBEC: 81.0000 mg | DELAYED_RELEASE_TABLET | Freq: Every day | ORAL | 3 refills | Status: AC

## 2019-09-19 NOTE — Progress Notes (Signed)
Cardiology Office Note:    Date:  09/19/2019   ID:  Warren Lee, DOB Sep 12, 1951, MRN [id number]  PCP:  Monico Blitz, MD  Cardiologist:  Buford Dresser, MD PhD (Dr. Claiborne Billings, last in 2014)  Referring MD: Monico Blitz, MD   CC: New patient, preoperative evaluation  History of Present Illness:    Warren Lee is a 68 y.o. male with a hx of CAD, HTN, HLD, type II diabetes who is seen as a new consult at the request of Monico Blitz, MD for the evaluation and management of preoperative clearance.  Preoperative cardiovascular evaluation  Planned surgery: cervical fusion, date TBD. Dr. Frederich Cha. Planned for general anesthesia.  Pertinent past cardiac history: CAD s/p PTCA of diag 1998. Last cath 2003, no new obstructive disease. HTN, HLD, type II diabetes.  Prior cardiac workup: last MPI 2012 History of valve disease: none History of heart failure: none History of arrhythmia: none On anticoagulation: on aspirin 325 mg, unclear why higher dose vs 81 mg. He is not sure why he is on the higher dose Additional history (hypertension, diabetes/on insulin, CKD/current creatinine, OSA, anesthesia complications): Current symptoms: no chest pain.  Functional capacity: less than 4 METs. On CPAP and O2 at night. Sees Dr. Davina Poke in [place] for his lungs. Has been told to quit smoking. Had bronchoscopy in the past. Been told his function is not too good. I cannot see any data from this group in Care Everywhere  He is severely short of breath with minimal exertion. With walking in from waiting room, he was audibly wheezing. O2 sats in low 90s and then increased. Smoking about 1.5 ppd currently. Discussed smoking cessation at length. He feels overwhelmed, wants to quit but except for one 3 year span has been unsuccessful.  PSH notable for complicated post-cholecystectomy course including ex-laps, prolonged intubation/ICU stay, transfusions.   Denies chest pain. Does have shortness of  breath at rest and with normal exertion. No PND, orthopnea, LE edema or unexpected weight gain. No syncope or palpitations.  Past Medical History:  Diagnosis Date  . Abdominal wall fistula   . Anxiety   . Chronic back pain   . Chronic pancreatitis [facility])    CT July 2012: Chronic calcific pancreatitis without evidence of acute  . Coronary artery disease   . Diabetes mellitus   . Heart disease   . Hypercholesterolemia   . Hypertension   . Pancreatitis   . S/P colonoscopy 2007   Dr. Lindalou Hose: normal   . S/P endoscopy July 2012   normal  . Shortness of breath   . Systolic murmur     Past Surgical History:  Procedure Laterality Date  . CARDIAC CATHETERIZATION  2003  . CHEST TUBE INSERTION    . CHOLECYSTECTOMY     with complications, prolonged hospitalization, with bile leak  . ESOPHAGOGASTRODUODENOSCOPY  07/22/2011   Procedure: ESOPHAGOGASTRODUODENOSCOPY (EGD);  Surgeon: Daneil Dolin, MD;  [facility];  Service: Endoscopy;  Laterality: N/A;  . exploratory laparotomy X 2     after complications with chole  . PTCA  1998  . TRACHEOSTOMY      Current Medications: Current Outpatient Medications on File Prior to Visit  Medication Sig  . ANORO ELLIPTA 62.5-25 MCG/INH AEPB Inhale 1 puff into the lungs daily.  . carvedilol (COREG) 6.25 MG tablet TAKE 1 TABLET BY MOUTH TWICE A DAY WITH A MEAL (MUST MAKE APPT FOR MORE REFILLS)  . glimepiride (AMARYL) 2 MG tablet Take 2 mg by mouth  daily.   Marland Kitchen HYDROcodone-acetaminophen (NORCO/VICODIN) 5-325 MG tablet Take 1-2 tablets by mouth every 4 (four) hours as needed.  . metFORMIN (GLUCOPHAGE) 850 MG tablet Take 850 mg by mouth 2 (two) times daily with a meal.  . Multiple Vitamin (MULTIVITAMIN WITH MINERALS) TABS tablet Take 1 tablet by mouth daily.  Marland Kitchen omeprazole (PRILOSEC) 20 MG capsule Take 20 mg by mouth daily.  Marland Kitchen PARoxetine (PAXIL) 20 MG tablet Take 20 mg by mouth daily.   . pioglitazone (ACTOS) 30 MG tablet Take 30 mg by mouth  daily.  . rosuvastatin (CRESTOR) 10 MG tablet Take 10 mg by mouth at bedtime.  . valsartan-hydrochlorothiazide (DIOVAN-HCT) 320-12.5 MG tablet Take 1 tablet by mouth daily.   No current facility-administered medications on file prior to visit.      Allergies:   Patient has no known allergies.   Social History   Socioeconomic History  . Marital status: Married    Spouse name: Not on file  . Number of children: Not on file  . Years of education: Not on file  . Highest education level: Not on file  Occupational History  . Not on file  Social Needs  . Financial resource strain: Not on file  . Food insecurity    Worry: Not on file    Inability: Not on file  . Transportation needs    Medical: Not on file    Non-medical: Not on file  Tobacco Use  . Smoking status: Current Every Day Smoker    Packs/day: 1.50    Years: 40.00    Pack years: 60.00    Types: Cigarettes  . Smokeless tobacco: Never Used  Substance and Sexual Activity  . Alcohol use: No  . Drug use: No  . Sexual activity: Not on file  Lifestyle  . Physical activity    Days per week: Not on file    Minutes per session: Not on file  . Stress: Not on file  Relationships  . Social Herbalist on phone: Not on file    Gets together: Not on file    Attends religious service: Not on file    Active member of club or organization: Not on file    Attends meetings of clubs or organizations: Not on file    Relationship status: Not on file  Other Topics Concern  . Not on file  Social History Narrative  . Not on file     Family History: The patient's family history includes Heart failure in his father. There is no history of Colon cancer.  ROS:   Please see the history of present illness.  Additional pertinent ROS: Constitutional: Negative for chills, fever, night sweats, unintentional weight loss  HENT: Negative for ear pain and hearing loss.   Eyes: Negative for loss of vision and eye pain.   Respiratory: Negative for cough, sputum, wheezing.   Cardiovascular: See HPI. Gastrointestinal: Negative for abdominal pain, melena, and hematochezia.  Genitourinary: Negative for dysuria and hematuria.  Musculoskeletal: Negative for falls and myalgias.  Skin: Negative for itching and rash.  Neurological: Negative for focal weakness, focal sensory changes and loss of consciousness.  Endo/Heme/Allergies: Does not bruise/bleed easily.     EKGs/Labs/Other Studies Reviewed:    The following studies were reviewed today: Myoview 2012 (most recent) normal  EKG:  EKG is personally reviewed.  The ekg ordered today demonstrates sinus rhythm with PACs with aberrant conduction  Recent Labs: No results found for requested labs within last 8760  hours.  Recent Lipid Panel    Component Value Date/Time   CHOL 111 09/29/2013 0804   TRIG 95 09/29/2013 0804   HDL 32 (L) 09/29/2013 0804   CHOLHDL 3.5 09/29/2013 0804   VLDL 19 09/29/2013 0804   LDLCALC 60 09/29/2013 0804    Physical Exam:    VS:  BP (!) 158/71   Pulse 88   Temp (!) 97.1 F (36.2 C)   Ht 5\' 11"  (1.803 m)   Wt 206 lb 12.8 oz (93.8 kg)   SpO2 95%   BMI 28.84 kg/m     Wt Readings from Last 3 Encounters:  09/19/19 206 lb 12.8 oz (93.8 kg)  04/05/17 200 lb (90.7 kg)  09/08/13 200 lb 6.4 oz (90.9 kg)    GEN: Well nourished, well developed in no acute distress HEENT: Normal, moist mucous membranes NECK: No JVD CARDIAC: regular rhythm, normal S1 and S2, no murmurs, rubs, gallops.  VASCULAR: Radial and DP pulses 2+ bilaterally. No carotid bruits RESPIRATORY: Very distant breath sounds bilaterally. No crackles, no wheezing. ABDOMEN: Soft, non-tender, non-distended MUSCULOSKELETAL:  Ambulates independently SKIN: Warm and dry, no edema NEUROLOGIC:  Alert and oriented x 3. No focal neuro deficits noted. PSYCHIATRIC:  Normal affect    ASSESSMENT:    1. Pre-op evaluation   2. Coronary artery disease involving native coronary  artery of native heart with angina pectoris ([place])   3. DOE (dyspnea on exertion)   4. Hyperlipidemia with target low density lipoprotein (LDL) cholesterol less than 70 mg/dL   5. Type 2 diabetes mellitus without obesity ([place])   6. Tobacco abuse   7. Tobacco abuse counseling    PLAN:    Preoperative cardiovascular exam: The Revised Cardiac Risk Index indicates that his  RCRI score is 1 for history of CAD. He has no active cardiac symptoms. However, he cannot achieve >4 METs of activity.  According to ACC/AHA guidelines, the patient will require a nuclear perfusion imaging test (stress test) before a decision can be made regarding surgery.  My larger concern is his breathing. He was mildly short of breath at rest and could barely walk down the hall. I would recommend getting clearance from his pulmonologist Dr. Davina Poke in [place], [place]. He may require prolonged intubation or NIPPV postoperatively.   I recommended he quit smoking, continue using CPAP, and drop his aspirin dose to 81 mg daily.  Tobacco use: The patient was counseled on tobacco cessation today for 7 minutes.  Counseling included reviewing the risks of smoking tobacco products, how it impacts the patient's current medical diagnoses and different strategies for quitting.  Pharmacotherapy to aid in tobacco cessation was not prescribed today.  Cardiac risk counseling and prevention recommendations: -recommend heart healthy/Mediterranean diet, with whole grains, fruits, vegetable, fish, lean meats, nuts, and olive oil. Limit salt. -recommend moderate walking, 3-5 times/week for 30-50 minutes each session. Aim for at least 150 minutes.week. Goal should be pace of 3 miles/hours, or walking 1.5 miles in 30 minutes -recommend avoidance of tobacco products. Avoid excess alcohol.  Additional risk factors: Type II diabetes: not on insulin. Would consider changing to SGLT2i or GLP1RA with his CAD history instead of pioglitazone, defer to PCP   Hypertension: Initially elevated on arrival but improved -continue carvedilol, valsartan-HCTZ  Hyperlipidemia: I do not have records of his last LDL, but his last Tchol was 87 -continue rosuvastatin  Plan for follow up: He would prefer to follow up in [place] due to location near his house. I will  follow up his nuclear study, then I recommend 3 mos follow up with one of my colleagues in [place]  Medication Adjustments/Labs and Tests Ordered: Current medicines are reviewed at length with the patient today.  Concerns regarding medicines are outlined above.  Orders Placed This Encounter  Procedures  . MYOCARDIAL PERFUSION IMAGING  . EKG 12-Lead   Meds ordered this encounter  Medications  . aspirin EC 81 MG tablet    Sig: Take 1 tablet (81 mg total) by mouth daily.    Dispense:  90 tablet    Refill:  3    Patient Instructions  Medication Instructions:  Decrease Aspirin to 81 mg daily   If you need a refill on your cardiac medications before your next appointment, please call your pharmacy.   Lab work: None  Testing/Procedures: Your physician has requested that you have a lexiscan myoview. For further information please visit [email or web address]. Please follow instruction sheet, as given.   Follow-Up: Your physician recommends that you schedule a follow-up appointment in 3 months in [place] office.  You are scheduled for a Myocardial Perfusion Imaging Study.  Please arrive 15 minutes prior to your appointment time for registration and insurance purposes.  The test will take approximately 3 to 4 hours to complete; you may bring reading material.  If someone comes with you to your appointment, they will need to remain in the main lobby due to limited space in the testing area. **If you are pregnant or breastfeeding, please notify the nuclear lab prior to your appointment**  How to prepare for your Myocardial Perfusion Test: . Do not eat or drink 3 hours prior to your test, except you may  have water. . Do not consume products containing caffeine (regular or decaffeinated) 12 hours prior to your test. (ex: coffee, chocolate, sodas, tea). . Do bring a list of your current medications with you.  If not listed below, you may take your medications as normal. . Do wear comfortable clothes (no dresses or overalls) and walking shoes, tennis shoes preferred (No heels or open toe shoes are allowed). . Do NOT wear cologne, perfume, aftershave, or lotions (deodorant is allowed). . If these instructions are not followed, your test will have to be rescheduled.  Please report to [place], [address] for your test.  If you have questions or concerns about your appointment, you can call the Nuclear Lab at [phone].  If you cannot keep your appointment, please provide 24 hours notification to the Nuclear Lab, to avoid a possible $50 charge to your account.      Signed, Buford Dresser, MD PhD 09/19/2019 6:46 PM    [place]

## 2019-09-19 NOTE — Patient Instructions (Signed)
Medication Instructions:  Decrease Aspirin to 81 mg daily   If you need a refill on your cardiac medications before your next appointment, please call your pharmacy.   Lab work: None  Testing/Procedures: Your physician has requested that you have a lexiscan myoview. For further information please visit [email or web address]. Please follow instruction sheet, as given.   Follow-Up: Your physician recommends that you schedule a follow-up appointment in 3 months in [place] office.  You are scheduled for a Myocardial Perfusion Imaging Study.  Please arrive 15 minutes prior to your appointment time for registration and insurance purposes.  The test will take approximately 3 to 4 hours to complete; you may bring reading material.  If someone comes with you to your appointment, they will need to remain in the main lobby due to limited space in the testing area. **If you are pregnant or breastfeeding, please notify the nuclear lab prior to your appointment**  How to prepare for your Myocardial Perfusion Test: . Do not eat or drink 3 hours prior to your test, except you may have water. . Do not consume products containing caffeine (regular or decaffeinated) 12 hours prior to your test. (ex: coffee, chocolate, sodas, tea). . Do bring a list of your current medications with you.  If not listed below, you may take your medications as normal. . Do wear comfortable clothes (no dresses or overalls) and walking shoes, tennis shoes preferred (No heels or open toe shoes are allowed). . Do NOT wear cologne, perfume, aftershave, or lotions (deodorant is allowed). . If these instructions are not followed, your test will have to be rescheduled.  Please report to [place], [address] for your test.  If you have questions or concerns about your appointment, you can call the Nuclear Lab at [phone].  If you cannot keep your appointment, please provide 24 hours notification to the Nuclear Lab, to avoid  a possible $50 charge to your account.

## 2019-09-20 ENCOUNTER — Telehealth (HOSPITAL_COMMUNITY): Payer: Self-pay

## 2019-09-20 NOTE — Telephone Encounter (Signed)
Encounter complete. 

## 2019-09-22 ENCOUNTER — Other Ambulatory Visit: Payer: Self-pay

## 2019-09-22 ENCOUNTER — Ambulatory Visit (HOSPITAL_COMMUNITY)
Admission: RE | Admit: 2019-09-22 | Discharge: 2019-09-22 | Disposition: A | Discharge status: Home / Self Care | Payer: Medicare Other | Source: Ambulatory Visit | Attending: Cardiology | Admitting: Cardiology

## 2019-09-22 DIAGNOSIS — I25119 Atherosclerotic heart disease of native coronary artery with unspecified angina pectoris: Secondary | ICD-10-CM

## 2019-09-22 DIAGNOSIS — Z01818 Encounter for other preprocedural examination: Secondary | ICD-10-CM | POA: Diagnosis not present

## 2019-09-22 MED ORDER — TECHNETIUM TC 99M TETROFOSMIN IV KIT
10.6000 | PACK | Freq: Once | INTRAVENOUS | Status: AC | PRN
  Administered 2019-09-22: 10.6 via INTRAVENOUS
  Filled 2019-09-22: qty 11

## 2019-09-22 MED ORDER — REGADENOSON 0.4 MG/5ML IV SOLN
0.4000 mg | Freq: Once | INTRAVENOUS | Status: AC
  Administered 2019-09-22: 0.4 mg via INTRAVENOUS

## 2019-09-22 MED ORDER — TECHNETIUM TC 99M TETROFOSMIN IV KIT
30.4000 | PACK | Freq: Once | INTRAVENOUS | Status: AC | PRN
  Administered 2019-09-22: 30.4 via INTRAVENOUS
  Filled 2019-09-22: qty 31

## 2019-09-25 LAB — MYOCARDIAL PERFUSION IMAGING
LV dias vol: 95 mL (ref 62–150)
LV sys vol: 39 mL
Peak HR: 93 {beats}/min
Rest HR: 64 {beats}/min
SDS: 6
SRS: 3
SSS: 9
TID: 1.1

## 2019-09-26 ENCOUNTER — Telehealth: Payer: Self-pay | Admitting: Cardiology

## 2019-09-26 NOTE — Telephone Encounter (Signed)
Patient wanted to know the results of the stress test he had done Friday 09-22-19. Please call to discuss

## 2019-09-26 NOTE — Telephone Encounter (Signed)
Gave results to pt. Verbalized understanding.

## 2019-09-27 DIAGNOSIS — G4733 Obstructive sleep apnea (adult) (pediatric): Secondary | ICD-10-CM | POA: Diagnosis not present

## 2019-09-29 DIAGNOSIS — I10 Essential (primary) hypertension: Secondary | ICD-10-CM | POA: Diagnosis not present

## 2019-09-29 DIAGNOSIS — J449 Chronic obstructive pulmonary disease, unspecified: Secondary | ICD-10-CM | POA: Diagnosis not present

## 2019-09-29 DIAGNOSIS — Z299 Encounter for prophylactic measures, unspecified: Secondary | ICD-10-CM | POA: Diagnosis not present

## 2019-09-29 DIAGNOSIS — Z6830 Body mass index (BMI) 30.0-30.9, adult: Secondary | ICD-10-CM | POA: Diagnosis not present

## 2019-09-29 DIAGNOSIS — Z23 Encounter for immunization: Secondary | ICD-10-CM | POA: Diagnosis not present

## 2019-09-29 DIAGNOSIS — M542 Cervicalgia: Secondary | ICD-10-CM | POA: Diagnosis not present

## 2019-10-10 ENCOUNTER — Telehealth: Payer: Self-pay

## 2019-10-10 NOTE — Telephone Encounter (Signed)
   [facility] Pre-operative Risk Assessment    Request for surgical clearance:  1. What type of surgery is being performed? CERVICAL FUSION   2. When is this surgery scheduled? TBD   3. What type of clearance is required (medical clearance vs. Pharmacy clearance to hold med vs. Both)?   BOTH  4. Are there any medications that need to be held prior to surgery and how long? NOT LISTED PT TAKES ASA   5. Practice name and name of physician performing surgery? [facility]SP[facility] DR JEFFERY JENKINS ATTN: [email or web address]   6. What is your office phone number  [phone]    7.   What is your office fax number  [phone]  8.   Anesthesia type (None, local, MAC, general) ? GENERAL

## 2019-10-10 NOTE — Telephone Encounter (Signed)
   Primary Cardiologist: Buford Dresser, MD  Chart reviewed as part of pre-operative protocol coverage. Pt was seen by Dr. Harrell Gave on 09/26/2019 for cardiac clearance. It was recommended that he undergo a nuclear stress test. Results were discussed with Dr. Arnoldo Morale. From a cardiac perspective, the patient was cleared for surgery after stress testing however it was highly recommended that he also be cleared from his pulmonologist as he was extremely SOB with audible wheezing with very little exertion during his appointment.   Given past medical history and time since last visit, based on ACC/AHA guidelines, Warren Lee would be at high but acceptable risk for the planned procedure. Please see above notation about pulmonary clearance as well.   I will route this recommendation to the requesting party via Epic fax function and remove from pre-op pool.  Please call with questions.  Kathyrn Drown, NP 10/10/2019, 1:31 PM

## 2019-10-17 DIAGNOSIS — J449 Chronic obstructive pulmonary disease, unspecified: Secondary | ICD-10-CM | POA: Diagnosis not present

## 2019-10-17 DIAGNOSIS — R0902 Hypoxemia: Secondary | ICD-10-CM | POA: Diagnosis not present

## 2019-10-17 DIAGNOSIS — G4733 Obstructive sleep apnea (adult) (pediatric): Secondary | ICD-10-CM | POA: Diagnosis not present

## 2019-10-17 DIAGNOSIS — J984 Other disorders of lung: Secondary | ICD-10-CM | POA: Diagnosis not present

## 2019-10-27 DIAGNOSIS — Z1159 Encounter for screening for other viral diseases: Secondary | ICD-10-CM | POA: Diagnosis not present

## 2019-10-30 DIAGNOSIS — G4733 Obstructive sleep apnea (adult) (pediatric): Secondary | ICD-10-CM | POA: Diagnosis not present

## 2019-10-31 DIAGNOSIS — Z720 Tobacco use: Secondary | ICD-10-CM | POA: Diagnosis not present

## 2019-10-31 DIAGNOSIS — R0689 Other abnormalities of breathing: Secondary | ICD-10-CM | POA: Diagnosis not present

## 2019-10-31 DIAGNOSIS — J449 Chronic obstructive pulmonary disease, unspecified: Secondary | ICD-10-CM | POA: Diagnosis not present

## 2019-10-31 DIAGNOSIS — R0602 Shortness of breath: Secondary | ICD-10-CM | POA: Diagnosis not present

## 2019-11-07 DIAGNOSIS — Z6828 Body mass index (BMI) 28.0-28.9, adult: Secondary | ICD-10-CM | POA: Diagnosis not present

## 2019-11-07 DIAGNOSIS — I10 Essential (primary) hypertension: Secondary | ICD-10-CM | POA: Diagnosis not present

## 2019-11-15 DIAGNOSIS — I10 Essential (primary) hypertension: Secondary | ICD-10-CM | POA: Diagnosis not present

## 2019-11-15 DIAGNOSIS — Z299 Encounter for prophylactic measures, unspecified: Secondary | ICD-10-CM | POA: Diagnosis not present

## 2019-11-15 DIAGNOSIS — Z6830 Body mass index (BMI) 30.0-30.9, adult: Secondary | ICD-10-CM | POA: Diagnosis not present

## 2019-11-15 DIAGNOSIS — Z713 Dietary counseling and surveillance: Secondary | ICD-10-CM | POA: Diagnosis not present

## 2019-11-15 DIAGNOSIS — E1165 Type 2 diabetes mellitus with hyperglycemia: Secondary | ICD-10-CM | POA: Diagnosis not present

## 2019-12-19 ENCOUNTER — Ambulatory Visit (INDEPENDENT_AMBULATORY_CARE_PROVIDER_SITE_OTHER): Payer: Medicare Other | Admitting: Cardiovascular Disease

## 2019-12-19 ENCOUNTER — Encounter: Payer: Self-pay | Admitting: Cardiovascular Disease

## 2019-12-19 VITALS — Ht 71.0 in | Wt 210.0 lb

## 2019-12-19 DIAGNOSIS — I25119 Atherosclerotic heart disease of native coronary artery with unspecified angina pectoris: Secondary | ICD-10-CM | POA: Diagnosis not present

## 2019-12-19 DIAGNOSIS — G4733 Obstructive sleep apnea (adult) (pediatric): Secondary | ICD-10-CM

## 2019-12-19 DIAGNOSIS — I25118 Atherosclerotic heart disease of native coronary artery with other forms of angina pectoris: Secondary | ICD-10-CM

## 2019-12-19 DIAGNOSIS — E785 Hyperlipidemia, unspecified: Secondary | ICD-10-CM

## 2019-12-19 DIAGNOSIS — Z9989 Dependence on other enabling machines and devices: Secondary | ICD-10-CM

## 2019-12-19 DIAGNOSIS — I10 Essential (primary) hypertension: Secondary | ICD-10-CM | POA: Diagnosis not present

## 2019-12-19 DIAGNOSIS — Z720 Tobacco use: Secondary | ICD-10-CM | POA: Diagnosis not present

## 2019-12-19 DIAGNOSIS — J449 Chronic obstructive pulmonary disease, unspecified: Secondary | ICD-10-CM

## 2019-12-19 NOTE — Progress Notes (Signed)
Virtual Visit via Telephone Note   This visit type was conducted due to national recommendations for restrictions regarding the COVID-19 Pandemic (e.g. social distancing) in an effort to limit this patient's exposure and mitigate transmission in our community.  Due to his co-morbid illnesses, this patient is at least at moderate risk for complications without adequate follow up.  This format is felt to be most appropriate for this patient at this time.  The patient did not have access to video technology/had technical difficulties with video requiring transitioning to audio format only (telephone).  All issues noted in this document were discussed and addressed.  No physical exam could be performed with this format.  Please refer to the patient's chart for his  consent to telehealth for [facility].   Date:  12/19/2019   ID:  Warren Lee, DOB [birth year], MRN [id number]  Patient Location: Home Provider Location: Office  PCP:  Monico Blitz, MD  Cardiologist:  No primary care provider on file.  Electrophysiologist:  None   Evaluation Performed:  Follow-Up Visit  Chief Complaint:  CAD  History of Present Illness:    Warren Lee is a 68 y.o. male with coronary artery disease status post PTCA of diagonal in 1998.  Last catheterization 2003 showed no new obstructive disease.  He also has hypertension, hyperlipidemia, obstructive sleep apnea, tobacco abuse, and type 2 diabetes mellitus.  He was seen by Dr. Harrell Gave for preoperative evaluation prior to undergoing cervical fusion under general anesthesia in September 2020.  He follows with pulmonology in [place], [place].  He underwent a nuclear stress test on 09/22/2019 which demonstrated ECG abnormalities.  There was a small defect of mild severity in the apex.  Overall it was deemed a low risk study.  LVEF 59%.  His truck broke down today and he's a bit upset.  He denies chest pain. Chronic exertional dyspnea is stable. He uses  CPAP.  He did not undergo cervical spine surgery due to his COPD. He uses 3L oxygen by nasal cannula.  He has severe neck pain.    Past Medical History:  Diagnosis Date  . Abdominal wall fistula   . Anxiety   . Chronic back pain   . Chronic pancreatitis [facility])    CT July 2012: Chronic calcific pancreatitis without evidence of acute  . Coronary artery disease   . Diabetes mellitus   . Heart disease   . Hypercholesterolemia   . Hypertension   . Pancreatitis   . S/P colonoscopy 2007   Dr. Lindalou Hose: normal   . S/P endoscopy July 2012   normal  . Shortness of breath   . Systolic murmur    Past Surgical History:  Procedure Laterality Date  . CARDIAC CATHETERIZATION  2003  . CHEST TUBE INSERTION    . CHOLECYSTECTOMY     with complications, prolonged hospitalization, with bile leak  . ESOPHAGOGASTRODUODENOSCOPY  07/22/2011   Procedure: ESOPHAGOGASTRODUODENOSCOPY (EGD);  Surgeon: Daneil Dolin, MD;  [facility];  Service: Endoscopy;  Laterality: N/A;  . exploratory laparotomy X 2     after complications with chole  . PTCA  1998  . TRACHEOSTOMY       Current Meds  Medication Sig  . ANORO ELLIPTA 62.5-25 MCG/INH AEPB Inhale 1 puff into the lungs daily.  Marland Kitchen aspirin EC 81 MG tablet Take 1 tablet (81 mg total) by mouth daily.  . carvedilol (COREG) 6.25 MG tablet TAKE 1 TABLET BY MOUTH TWICE A DAY WITH A MEAL (MUST  MAKE APPT FOR MORE REFILLS)  . glimepiride (AMARYL) 2 MG tablet Take 2 mg by mouth daily.   Marland Kitchen HYDROcodone-acetaminophen (NORCO/VICODIN) 5-325 MG tablet Take 1-2 tablets by mouth every 4 (four) hours as needed.  . metFORMIN (GLUCOPHAGE) 850 MG tablet Take 850 mg by mouth 2 (two) times daily with a meal.  . Multiple Vitamin (MULTIVITAMIN WITH MINERALS) TABS tablet Take 1 tablet by mouth daily.  Marland Kitchen omeprazole (PRILOSEC) 20 MG capsule Take 20 mg by mouth daily.  Marland Kitchen PARoxetine (PAXIL) 20 MG tablet Take 20 mg by mouth daily.   . pioglitazone (ACTOS) 30 MG tablet  Take 30 mg by mouth daily.  . rosuvastatin (CRESTOR) 10 MG tablet Take 10 mg by mouth at bedtime.  . valsartan-hydrochlorothiazide (DIOVAN-HCT) 320-12.5 MG tablet Take 1 tablet by mouth daily.     Allergies:   Patient has no known allergies.   Social History   Tobacco Use  . Smoking status: Current Every Day Smoker    Packs/day: 1.50    Years: 40.00    Pack years: 60.00    Types: Cigarettes  . Smokeless tobacco: Never Used  Substance Use Topics  . Alcohol use: No  . Drug use: No     Family Hx: The patient's family history includes Heart failure in his father. There is no history of Colon cancer.  ROS:   Please see the history of present illness.     All other systems reviewed and are negative.   Prior CV studies:   The following studies were reviewed today:  Reviewed above  Labs/Other Tests and Data Reviewed:    EKG:  No ECG reviewed.  Recent Labs: No results found for requested labs within last 8760 hours.   Recent Lipid Panel Lab Results  Component Value Date/Time   CHOL 111 09/29/2013 08:04 AM   TRIG 95 09/29/2013 08:04 AM   HDL 32 (L) 09/29/2013 08:04 AM   CHOLHDL 3.5 09/29/2013 08:04 AM   LDLCALC 60 09/29/2013 08:04 AM    Wt Readings from Last 3 Encounters:  12/19/19 210 lb (95.3 kg)  09/22/19 206 lb (93.4 kg)  09/19/19 206 lb 12.8 oz (93.8 kg)     Objective:    Vital Signs:  Ht 5\' 11"  (1.803 m)   Wt 210 lb (95.3 kg)   BMI 29.29 kg/m    VITAL SIGNS:  reviewed  ASSESSMENT & PLAN:    1.  Coronary disease: Status post PTCA of diagonal in 1998.  Last catheterization 2003 showed no new obstructive disease. It showed 20-30% proximal eccentric circumflex stenosis as well as 40% mid AV groove circumflex stenosis.  He underwent a low risk nuclear stress test on 09/22/2019.  Continue medical therapy with aspirin, carvedilol, and rosuvastatin.  2.  Hypertension: Continue present therapy which includes carvedilol and Diovan-HCTZ.  3.  Hyperlipidemia:  Continue rosuvastatin.  4.  Obstructive sleep apnea: He uses CPAP nightly.  5.  COPD: Needs tobacco cessation. Uses 3L oxygen by nasal cannula.  6.  Tobacco abuse: Smokes 1.5 ppd.     COVID-19 Education: The signs and symptoms of COVID-19 were discussed with the patient and how to seek care for testing (follow up with PCP or arrange E-visit).  The importance of social distancing was discussed today.  Time:   Today, I have spent 25 minutes with the patient with telehealth technology discussing the above problems.     Medication Adjustments/Labs and Tests Ordered: Current medicines are reviewed at length with the patient today.  Concerns regarding medicines are outlined above.   Tests Ordered: No orders of the defined types were placed in this encounter.   Medication Changes: No orders of the defined types were placed in this encounter.   Follow Up:  Virtual Visit  in 1 year(s)  Signed, Kate Sable, MD  12/19/2019 12:48 PM    [place]

## 2019-12-19 NOTE — Patient Instructions (Signed)
Medication Instructions:  Your physician recommends that you continue on your current medications as directed. Please refer to the Current Medication list given to you today.  *If you need a refill on your cardiac medications before your next appointment, please call your pharmacy*  Lab Work: None today If you have labs (blood work) drawn today and your tests are completely normal, you will receive your results only by: Marland Kitchen MyChart Message (if you have MyChart) OR . A paper copy in the mail If you have any lab test that is abnormal or we need to change your treatment, we will call you to review the results.  Testing/Procedures: None todayu  Follow-Up: At [facility], you and your health needs are our priority.  As part of our continuing mission to provide you with exceptional heart care, we have created designated Provider Care Teams.  These Care Teams include your primary Cardiologist (physician) and Advanced Practice Providers (APPs -  Physician Assistants and Nurse Practitioners) who all work together to provide you with the care you need, when you need it.  Your next appointment:   12 month(s)  The format for your next appointment:   Virtual Visit   Provider:   Kate Sable, MD  Other Instructions None.

## 2019-12-27 DIAGNOSIS — Z03818 Encounter for observation for suspected exposure to other biological agents ruled out: Secondary | ICD-10-CM | POA: Diagnosis not present

## 2020-01-12 DIAGNOSIS — J029 Acute pharyngitis, unspecified: Secondary | ICD-10-CM | POA: Diagnosis not present

## 2020-01-12 DIAGNOSIS — Z299 Encounter for prophylactic measures, unspecified: Secondary | ICD-10-CM | POA: Diagnosis not present

## 2020-01-12 DIAGNOSIS — J449 Chronic obstructive pulmonary disease, unspecified: Secondary | ICD-10-CM | POA: Diagnosis not present

## 2020-01-12 DIAGNOSIS — F17210 Nicotine dependence, cigarettes, uncomplicated: Secondary | ICD-10-CM | POA: Diagnosis not present

## 2020-01-12 DIAGNOSIS — Z6830 Body mass index (BMI) 30.0-30.9, adult: Secondary | ICD-10-CM | POA: Diagnosis not present

## 2020-01-12 DIAGNOSIS — M542 Cervicalgia: Secondary | ICD-10-CM | POA: Diagnosis not present

## 2020-01-12 DIAGNOSIS — I10 Essential (primary) hypertension: Secondary | ICD-10-CM | POA: Diagnosis not present

## 2020-01-17 DIAGNOSIS — M5412 Radiculopathy, cervical region: Secondary | ICD-10-CM | POA: Diagnosis not present

## 2020-01-17 DIAGNOSIS — M4802 Spinal stenosis, cervical region: Secondary | ICD-10-CM | POA: Diagnosis not present

## 2020-02-07 DIAGNOSIS — M5412 Radiculopathy, cervical region: Secondary | ICD-10-CM | POA: Diagnosis not present

## 2020-02-07 DIAGNOSIS — M4802 Spinal stenosis, cervical region: Secondary | ICD-10-CM | POA: Diagnosis not present

## 2020-03-19 DIAGNOSIS — M542 Cervicalgia: Secondary | ICD-10-CM | POA: Diagnosis not present

## 2020-04-11 DIAGNOSIS — M47812 Spondylosis without myelopathy or radiculopathy, cervical region: Secondary | ICD-10-CM | POA: Diagnosis not present

## 2020-04-21 DIAGNOSIS — G4733 Obstructive sleep apnea (adult) (pediatric): Secondary | ICD-10-CM | POA: Diagnosis present

## 2020-04-21 DIAGNOSIS — E7800 Pure hypercholesterolemia, unspecified: Secondary | ICD-10-CM | POA: Diagnosis present

## 2020-04-21 DIAGNOSIS — J449 Chronic obstructive pulmonary disease, unspecified: Secondary | ICD-10-CM | POA: Diagnosis not present

## 2020-04-21 DIAGNOSIS — I160 Hypertensive urgency: Secondary | ICD-10-CM | POA: Diagnosis not present

## 2020-04-21 DIAGNOSIS — E119 Type 2 diabetes mellitus without complications: Secondary | ICD-10-CM | POA: Diagnosis present

## 2020-04-21 DIAGNOSIS — J9601 Acute respiratory failure with hypoxia: Secondary | ICD-10-CM | POA: Diagnosis not present

## 2020-04-21 DIAGNOSIS — Z7982 Long term (current) use of aspirin: Secondary | ICD-10-CM | POA: Diagnosis not present

## 2020-04-21 DIAGNOSIS — F17200 Nicotine dependence, unspecified, uncomplicated: Secondary | ICD-10-CM | POA: Diagnosis present

## 2020-04-21 DIAGNOSIS — E785 Hyperlipidemia, unspecified: Secondary | ICD-10-CM | POA: Diagnosis not present

## 2020-04-21 DIAGNOSIS — R0602 Shortness of breath: Secondary | ICD-10-CM | POA: Diagnosis not present

## 2020-04-21 DIAGNOSIS — F329 Major depressive disorder, single episode, unspecified: Secondary | ICD-10-CM | POA: Diagnosis present

## 2020-04-21 DIAGNOSIS — Z20822 Contact with and (suspected) exposure to covid-19: Secondary | ICD-10-CM | POA: Diagnosis present

## 2020-04-21 DIAGNOSIS — I2510 Atherosclerotic heart disease of native coronary artery without angina pectoris: Secondary | ICD-10-CM | POA: Diagnosis not present

## 2020-04-21 DIAGNOSIS — E875 Hyperkalemia: Secondary | ICD-10-CM | POA: Diagnosis not present

## 2020-04-21 DIAGNOSIS — I10 Essential (primary) hypertension: Secondary | ICD-10-CM | POA: Diagnosis not present

## 2020-04-21 DIAGNOSIS — I110 Hypertensive heart disease with heart failure: Secondary | ICD-10-CM | POA: Diagnosis not present

## 2020-04-21 DIAGNOSIS — Z720 Tobacco use: Secondary | ICD-10-CM | POA: Diagnosis not present

## 2020-04-21 DIAGNOSIS — R062 Wheezing: Secondary | ICD-10-CM | POA: Diagnosis not present

## 2020-04-21 DIAGNOSIS — J441 Chronic obstructive pulmonary disease with (acute) exacerbation: Secondary | ICD-10-CM | POA: Diagnosis not present

## 2020-04-21 DIAGNOSIS — R0902 Hypoxemia: Secondary | ICD-10-CM | POA: Diagnosis not present

## 2020-04-21 DIAGNOSIS — I509 Heart failure, unspecified: Secondary | ICD-10-CM | POA: Diagnosis not present

## 2020-04-21 DIAGNOSIS — R0689 Other abnormalities of breathing: Secondary | ICD-10-CM | POA: Diagnosis not present

## 2020-04-21 DIAGNOSIS — Z7984 Long term (current) use of oral hypoglycemic drugs: Secondary | ICD-10-CM | POA: Diagnosis not present

## 2020-04-21 DIAGNOSIS — I5033 Acute on chronic diastolic (congestive) heart failure: Secondary | ICD-10-CM | POA: Diagnosis present

## 2020-04-22 DIAGNOSIS — R0602 Shortness of breath: Secondary | ICD-10-CM | POA: Diagnosis not present

## 2020-04-22 DIAGNOSIS — J9601 Acute respiratory failure with hypoxia: Secondary | ICD-10-CM | POA: Diagnosis not present

## 2020-04-22 DIAGNOSIS — I110 Hypertensive heart disease with heart failure: Secondary | ICD-10-CM | POA: Diagnosis not present

## 2020-04-22 DIAGNOSIS — J441 Chronic obstructive pulmonary disease with (acute) exacerbation: Secondary | ICD-10-CM | POA: Diagnosis not present

## 2020-04-23 DIAGNOSIS — J9601 Acute respiratory failure with hypoxia: Secondary | ICD-10-CM | POA: Diagnosis not present

## 2020-04-23 DIAGNOSIS — I509 Heart failure, unspecified: Secondary | ICD-10-CM | POA: Diagnosis not present

## 2020-04-23 DIAGNOSIS — I110 Hypertensive heart disease with heart failure: Secondary | ICD-10-CM | POA: Diagnosis not present

## 2020-04-23 DIAGNOSIS — R0602 Shortness of breath: Secondary | ICD-10-CM | POA: Diagnosis not present

## 2020-04-23 DIAGNOSIS — J441 Chronic obstructive pulmonary disease with (acute) exacerbation: Secondary | ICD-10-CM | POA: Diagnosis not present

## 2020-04-29 DIAGNOSIS — I5032 Chronic diastolic (congestive) heart failure: Secondary | ICD-10-CM | POA: Diagnosis not present

## 2020-04-29 DIAGNOSIS — E1165 Type 2 diabetes mellitus with hyperglycemia: Secondary | ICD-10-CM | POA: Diagnosis not present

## 2020-04-29 DIAGNOSIS — I10 Essential (primary) hypertension: Secondary | ICD-10-CM | POA: Diagnosis not present

## 2020-04-29 DIAGNOSIS — J449 Chronic obstructive pulmonary disease, unspecified: Secondary | ICD-10-CM | POA: Diagnosis not present

## 2020-04-29 DIAGNOSIS — Z299 Encounter for prophylactic measures, unspecified: Secondary | ICD-10-CM | POA: Diagnosis not present

## 2020-04-30 DIAGNOSIS — M47812 Spondylosis without myelopathy or radiculopathy, cervical region: Secondary | ICD-10-CM | POA: Diagnosis not present

## 2020-05-09 DIAGNOSIS — I2510 Atherosclerotic heart disease of native coronary artery without angina pectoris: Secondary | ICD-10-CM | POA: Diagnosis not present

## 2020-05-09 DIAGNOSIS — E785 Hyperlipidemia, unspecified: Secondary | ICD-10-CM | POA: Diagnosis not present

## 2020-05-09 DIAGNOSIS — I10 Essential (primary) hypertension: Secondary | ICD-10-CM | POA: Diagnosis not present

## 2020-05-09 DIAGNOSIS — I5032 Chronic diastolic (congestive) heart failure: Secondary | ICD-10-CM | POA: Diagnosis not present

## 2020-05-09 DIAGNOSIS — J449 Chronic obstructive pulmonary disease, unspecified: Secondary | ICD-10-CM | POA: Diagnosis not present

## 2020-05-09 DIAGNOSIS — Z87891 Personal history of nicotine dependence: Secondary | ICD-10-CM | POA: Diagnosis not present

## 2020-05-22 DIAGNOSIS — M47812 Spondylosis without myelopathy or radiculopathy, cervical region: Secondary | ICD-10-CM | POA: Diagnosis not present

## 2020-07-05 DIAGNOSIS — B356 Tinea cruris: Secondary | ICD-10-CM | POA: Diagnosis not present

## 2020-07-05 DIAGNOSIS — E1165 Type 2 diabetes mellitus with hyperglycemia: Secondary | ICD-10-CM | POA: Diagnosis not present

## 2020-07-05 DIAGNOSIS — Z299 Encounter for prophylactic measures, unspecified: Secondary | ICD-10-CM | POA: Diagnosis not present

## 2020-07-05 DIAGNOSIS — I5032 Chronic diastolic (congestive) heart failure: Secondary | ICD-10-CM | POA: Diagnosis not present

## 2020-07-05 DIAGNOSIS — M542 Cervicalgia: Secondary | ICD-10-CM | POA: Diagnosis not present

## 2020-07-15 DIAGNOSIS — M5412 Radiculopathy, cervical region: Secondary | ICD-10-CM | POA: Diagnosis not present

## 2020-07-15 DIAGNOSIS — I10 Essential (primary) hypertension: Secondary | ICD-10-CM | POA: Diagnosis not present

## 2020-07-15 DIAGNOSIS — M47812 Spondylosis without myelopathy or radiculopathy, cervical region: Secondary | ICD-10-CM | POA: Diagnosis not present

## 2020-07-15 DIAGNOSIS — M4802 Spinal stenosis, cervical region: Secondary | ICD-10-CM | POA: Diagnosis not present

## 2020-07-15 DIAGNOSIS — Z6827 Body mass index (BMI) 27.0-27.9, adult: Secondary | ICD-10-CM | POA: Diagnosis not present

## 2020-07-19 DIAGNOSIS — M4802 Spinal stenosis, cervical region: Secondary | ICD-10-CM | POA: Diagnosis not present

## 2020-07-19 DIAGNOSIS — I10 Essential (primary) hypertension: Secondary | ICD-10-CM | POA: Diagnosis not present

## 2020-07-19 DIAGNOSIS — M50221 Other cervical disc displacement at C4-C5 level: Secondary | ICD-10-CM | POA: Diagnosis not present

## 2020-07-19 DIAGNOSIS — M542 Cervicalgia: Secondary | ICD-10-CM | POA: Diagnosis not present

## 2020-07-19 DIAGNOSIS — Z87891 Personal history of nicotine dependence: Secondary | ICD-10-CM | POA: Diagnosis not present

## 2020-07-19 DIAGNOSIS — E119 Type 2 diabetes mellitus without complications: Secondary | ICD-10-CM | POA: Diagnosis not present

## 2020-07-19 DIAGNOSIS — G9519 Other vascular myelopathies: Secondary | ICD-10-CM | POA: Diagnosis not present

## 2020-07-19 DIAGNOSIS — M47812 Spondylosis without myelopathy or radiculopathy, cervical region: Secondary | ICD-10-CM | POA: Diagnosis not present

## 2020-07-29 DIAGNOSIS — M542 Cervicalgia: Secondary | ICD-10-CM | POA: Diagnosis not present

## 2020-07-29 DIAGNOSIS — Z6830 Body mass index (BMI) 30.0-30.9, adult: Secondary | ICD-10-CM | POA: Diagnosis not present

## 2020-07-29 DIAGNOSIS — I10 Essential (primary) hypertension: Secondary | ICD-10-CM | POA: Diagnosis not present

## 2020-07-29 DIAGNOSIS — Z299 Encounter for prophylactic measures, unspecified: Secondary | ICD-10-CM | POA: Diagnosis not present

## 2020-07-29 DIAGNOSIS — E1165 Type 2 diabetes mellitus with hyperglycemia: Secondary | ICD-10-CM | POA: Diagnosis not present

## 2020-07-29 DIAGNOSIS — J449 Chronic obstructive pulmonary disease, unspecified: Secondary | ICD-10-CM | POA: Diagnosis not present

## 2020-11-05 DIAGNOSIS — Z23 Encounter for immunization: Secondary | ICD-10-CM | POA: Diagnosis not present

## 2020-11-28 DIAGNOSIS — Z1331 Encounter for screening for depression: Secondary | ICD-10-CM | POA: Diagnosis not present

## 2020-11-28 DIAGNOSIS — Z299 Encounter for prophylactic measures, unspecified: Secondary | ICD-10-CM | POA: Diagnosis not present

## 2020-11-28 DIAGNOSIS — Z6829 Body mass index (BMI) 29.0-29.9, adult: Secondary | ICD-10-CM | POA: Diagnosis not present

## 2020-11-28 DIAGNOSIS — E1165 Type 2 diabetes mellitus with hyperglycemia: Secondary | ICD-10-CM | POA: Diagnosis not present

## 2020-11-28 DIAGNOSIS — Z7189 Other specified counseling: Secondary | ICD-10-CM | POA: Diagnosis not present

## 2020-11-28 DIAGNOSIS — Z79899 Other long term (current) drug therapy: Secondary | ICD-10-CM | POA: Diagnosis not present

## 2020-11-28 DIAGNOSIS — Z1339 Encounter for screening examination for other mental health and behavioral disorders: Secondary | ICD-10-CM | POA: Diagnosis not present

## 2020-11-28 DIAGNOSIS — Z0000 Encounter for general adult medical examination without abnormal findings: Secondary | ICD-10-CM | POA: Diagnosis not present

## 2020-11-28 DIAGNOSIS — E786 Lipoprotein deficiency: Secondary | ICD-10-CM | POA: Diagnosis not present

## 2020-11-29 DIAGNOSIS — Z125 Encounter for screening for malignant neoplasm of prostate: Secondary | ICD-10-CM | POA: Diagnosis not present

## 2020-11-29 DIAGNOSIS — R5383 Other fatigue: Secondary | ICD-10-CM | POA: Diagnosis not present

## 2020-11-29 DIAGNOSIS — E786 Lipoprotein deficiency: Secondary | ICD-10-CM | POA: Diagnosis not present

## 2020-11-29 DIAGNOSIS — Z79899 Other long term (current) drug therapy: Secondary | ICD-10-CM | POA: Diagnosis not present

## 2020-12-26 DIAGNOSIS — E1165 Type 2 diabetes mellitus with hyperglycemia: Secondary | ICD-10-CM | POA: Diagnosis not present

## 2020-12-31 DIAGNOSIS — U071 COVID-19: Secondary | ICD-10-CM | POA: Diagnosis not present

## 2020-12-31 DIAGNOSIS — J449 Chronic obstructive pulmonary disease, unspecified: Secondary | ICD-10-CM | POA: Diagnosis not present

## 2020-12-31 DIAGNOSIS — E1165 Type 2 diabetes mellitus with hyperglycemia: Secondary | ICD-10-CM | POA: Diagnosis not present

## 2020-12-31 DIAGNOSIS — Z299 Encounter for prophylactic measures, unspecified: Secondary | ICD-10-CM | POA: Diagnosis not present

## 2021-01-09 DIAGNOSIS — Z299 Encounter for prophylactic measures, unspecified: Secondary | ICD-10-CM | POA: Diagnosis not present

## 2021-01-09 DIAGNOSIS — U071 COVID-19: Secondary | ICD-10-CM | POA: Diagnosis not present

## 2021-01-09 DIAGNOSIS — F17210 Nicotine dependence, cigarettes, uncomplicated: Secondary | ICD-10-CM | POA: Diagnosis not present

## 2021-01-09 DIAGNOSIS — I10 Essential (primary) hypertension: Secondary | ICD-10-CM | POA: Diagnosis not present

## 2021-01-11 DIAGNOSIS — R0602 Shortness of breath: Secondary | ICD-10-CM | POA: Diagnosis not present

## 2021-01-11 DIAGNOSIS — J449 Chronic obstructive pulmonary disease, unspecified: Secondary | ICD-10-CM | POA: Diagnosis not present

## 2021-01-11 DIAGNOSIS — R0689 Other abnormalities of breathing: Secondary | ICD-10-CM | POA: Diagnosis not present

## 2021-01-27 DIAGNOSIS — E1165 Type 2 diabetes mellitus with hyperglycemia: Secondary | ICD-10-CM | POA: Diagnosis not present

## 2021-02-11 DIAGNOSIS — R0689 Other abnormalities of breathing: Secondary | ICD-10-CM | POA: Diagnosis not present

## 2021-02-11 DIAGNOSIS — J449 Chronic obstructive pulmonary disease, unspecified: Secondary | ICD-10-CM | POA: Diagnosis not present

## 2021-02-11 DIAGNOSIS — R0602 Shortness of breath: Secondary | ICD-10-CM | POA: Diagnosis not present

## 2021-02-14 DIAGNOSIS — I10 Essential (primary) hypertension: Secondary | ICD-10-CM | POA: Diagnosis not present

## 2021-02-14 DIAGNOSIS — F17210 Nicotine dependence, cigarettes, uncomplicated: Secondary | ICD-10-CM | POA: Diagnosis not present

## 2021-02-14 DIAGNOSIS — E1165 Type 2 diabetes mellitus with hyperglycemia: Secondary | ICD-10-CM | POA: Diagnosis not present

## 2021-02-14 DIAGNOSIS — R053 Chronic cough: Secondary | ICD-10-CM | POA: Diagnosis not present

## 2021-02-14 DIAGNOSIS — Z299 Encounter for prophylactic measures, unspecified: Secondary | ICD-10-CM | POA: Diagnosis not present

## 2021-02-14 DIAGNOSIS — U099 Post covid-19 condition, unspecified: Secondary | ICD-10-CM | POA: Diagnosis not present

## 2021-02-24 DIAGNOSIS — E1165 Type 2 diabetes mellitus with hyperglycemia: Secondary | ICD-10-CM | POA: Diagnosis not present

## 2021-03-10 DIAGNOSIS — G4733 Obstructive sleep apnea (adult) (pediatric): Secondary | ICD-10-CM | POA: Diagnosis not present

## 2021-03-11 DIAGNOSIS — R0602 Shortness of breath: Secondary | ICD-10-CM | POA: Diagnosis not present

## 2021-03-11 DIAGNOSIS — J449 Chronic obstructive pulmonary disease, unspecified: Secondary | ICD-10-CM | POA: Diagnosis not present

## 2021-03-11 DIAGNOSIS — R0689 Other abnormalities of breathing: Secondary | ICD-10-CM | POA: Diagnosis not present

## 2021-03-20 DIAGNOSIS — R059 Cough, unspecified: Secondary | ICD-10-CM | POA: Diagnosis not present

## 2021-03-20 DIAGNOSIS — J90 Pleural effusion, not elsewhere classified: Secondary | ICD-10-CM | POA: Diagnosis not present

## 2021-03-20 DIAGNOSIS — J449 Chronic obstructive pulmonary disease, unspecified: Secondary | ICD-10-CM | POA: Diagnosis not present

## 2021-03-27 DIAGNOSIS — E1165 Type 2 diabetes mellitus with hyperglycemia: Secondary | ICD-10-CM | POA: Diagnosis not present

## 2021-04-04 DIAGNOSIS — J449 Chronic obstructive pulmonary disease, unspecified: Secondary | ICD-10-CM | POA: Diagnosis not present

## 2021-04-04 DIAGNOSIS — I10 Essential (primary) hypertension: Secondary | ICD-10-CM | POA: Diagnosis not present

## 2021-04-04 DIAGNOSIS — I5032 Chronic diastolic (congestive) heart failure: Secondary | ICD-10-CM | POA: Diagnosis not present

## 2021-04-04 DIAGNOSIS — E1165 Type 2 diabetes mellitus with hyperglycemia: Secondary | ICD-10-CM | POA: Diagnosis not present

## 2021-04-04 DIAGNOSIS — Z299 Encounter for prophylactic measures, unspecified: Secondary | ICD-10-CM | POA: Diagnosis not present

## 2021-04-04 DIAGNOSIS — R319 Hematuria, unspecified: Secondary | ICD-10-CM | POA: Diagnosis not present

## 2021-04-11 DIAGNOSIS — R0689 Other abnormalities of breathing: Secondary | ICD-10-CM | POA: Diagnosis not present

## 2021-04-11 DIAGNOSIS — R0602 Shortness of breath: Secondary | ICD-10-CM | POA: Diagnosis not present

## 2021-04-11 DIAGNOSIS — J449 Chronic obstructive pulmonary disease, unspecified: Secondary | ICD-10-CM | POA: Diagnosis not present

## 2021-04-25 DIAGNOSIS — E1165 Type 2 diabetes mellitus with hyperglycemia: Secondary | ICD-10-CM | POA: Diagnosis not present

## 2021-05-11 DIAGNOSIS — J449 Chronic obstructive pulmonary disease, unspecified: Secondary | ICD-10-CM | POA: Diagnosis not present

## 2021-05-11 DIAGNOSIS — R0689 Other abnormalities of breathing: Secondary | ICD-10-CM | POA: Diagnosis not present

## 2021-05-11 DIAGNOSIS — R0602 Shortness of breath: Secondary | ICD-10-CM | POA: Diagnosis not present

## 2021-05-27 DIAGNOSIS — E1165 Type 2 diabetes mellitus with hyperglycemia: Secondary | ICD-10-CM | POA: Diagnosis not present

## 2021-06-11 DIAGNOSIS — R0602 Shortness of breath: Secondary | ICD-10-CM | POA: Diagnosis not present

## 2021-06-11 DIAGNOSIS — R0689 Other abnormalities of breathing: Secondary | ICD-10-CM | POA: Diagnosis not present

## 2021-06-11 DIAGNOSIS — J449 Chronic obstructive pulmonary disease, unspecified: Secondary | ICD-10-CM | POA: Diagnosis not present

## 2021-06-26 DIAGNOSIS — E1165 Type 2 diabetes mellitus with hyperglycemia: Secondary | ICD-10-CM | POA: Diagnosis not present

## 2021-07-02 DIAGNOSIS — Z87448 Personal history of other diseases of urinary system: Secondary | ICD-10-CM | POA: Diagnosis not present

## 2021-07-02 DIAGNOSIS — Z299 Encounter for prophylactic measures, unspecified: Secondary | ICD-10-CM | POA: Diagnosis not present

## 2021-07-02 DIAGNOSIS — R030 Elevated blood-pressure reading, without diagnosis of hypertension: Secondary | ICD-10-CM | POA: Diagnosis not present

## 2021-07-02 DIAGNOSIS — R319 Hematuria, unspecified: Secondary | ICD-10-CM | POA: Diagnosis not present

## 2021-07-02 DIAGNOSIS — I10 Essential (primary) hypertension: Secondary | ICD-10-CM | POA: Diagnosis not present

## 2021-07-02 DIAGNOSIS — J449 Chronic obstructive pulmonary disease, unspecified: Secondary | ICD-10-CM | POA: Diagnosis not present

## 2021-07-02 DIAGNOSIS — R350 Frequency of micturition: Secondary | ICD-10-CM | POA: Diagnosis not present

## 2021-07-02 DIAGNOSIS — F17210 Nicotine dependence, cigarettes, uncomplicated: Secondary | ICD-10-CM | POA: Diagnosis not present

## 2021-07-07 DIAGNOSIS — R310 Gross hematuria: Secondary | ICD-10-CM | POA: Diagnosis not present

## 2021-07-11 DIAGNOSIS — J449 Chronic obstructive pulmonary disease, unspecified: Secondary | ICD-10-CM | POA: Diagnosis not present

## 2021-07-11 DIAGNOSIS — R0689 Other abnormalities of breathing: Secondary | ICD-10-CM | POA: Diagnosis not present

## 2021-07-11 DIAGNOSIS — R0602 Shortness of breath: Secondary | ICD-10-CM | POA: Diagnosis not present

## 2021-07-17 DIAGNOSIS — R310 Gross hematuria: Secondary | ICD-10-CM | POA: Diagnosis not present

## 2021-07-17 DIAGNOSIS — K429 Umbilical hernia without obstruction or gangrene: Secondary | ICD-10-CM | POA: Diagnosis not present

## 2021-07-17 DIAGNOSIS — K439 Ventral hernia without obstruction or gangrene: Secondary | ICD-10-CM | POA: Diagnosis not present

## 2021-07-21 DIAGNOSIS — R310 Gross hematuria: Secondary | ICD-10-CM | POA: Diagnosis not present

## 2021-07-23 DIAGNOSIS — J441 Chronic obstructive pulmonary disease with (acute) exacerbation: Secondary | ICD-10-CM | POA: Diagnosis not present

## 2021-07-23 DIAGNOSIS — Z299 Encounter for prophylactic measures, unspecified: Secondary | ICD-10-CM | POA: Diagnosis not present

## 2021-07-23 DIAGNOSIS — F17210 Nicotine dependence, cigarettes, uncomplicated: Secondary | ICD-10-CM | POA: Diagnosis not present

## 2021-07-23 DIAGNOSIS — I10 Essential (primary) hypertension: Secondary | ICD-10-CM | POA: Diagnosis not present

## 2021-07-25 DIAGNOSIS — E1165 Type 2 diabetes mellitus with hyperglycemia: Secondary | ICD-10-CM | POA: Diagnosis not present

## 2021-07-27 DIAGNOSIS — E1165 Type 2 diabetes mellitus with hyperglycemia: Secondary | ICD-10-CM | POA: Diagnosis not present

## 2021-08-11 DIAGNOSIS — R0689 Other abnormalities of breathing: Secondary | ICD-10-CM | POA: Diagnosis not present

## 2021-08-11 DIAGNOSIS — R0602 Shortness of breath: Secondary | ICD-10-CM | POA: Diagnosis not present

## 2021-08-11 DIAGNOSIS — J449 Chronic obstructive pulmonary disease, unspecified: Secondary | ICD-10-CM | POA: Diagnosis not present

## 2021-08-27 DIAGNOSIS — E1165 Type 2 diabetes mellitus with hyperglycemia: Secondary | ICD-10-CM | POA: Diagnosis not present

## 2021-09-11 DIAGNOSIS — Z87891 Personal history of nicotine dependence: Secondary | ICD-10-CM | POA: Diagnosis not present

## 2021-09-11 DIAGNOSIS — W268XXA Contact with other sharp object(s), not elsewhere classified, initial encounter: Secondary | ICD-10-CM | POA: Diagnosis not present

## 2021-09-11 DIAGNOSIS — R0602 Shortness of breath: Secondary | ICD-10-CM | POA: Diagnosis not present

## 2021-09-11 DIAGNOSIS — E119 Type 2 diabetes mellitus without complications: Secondary | ICD-10-CM | POA: Diagnosis not present

## 2021-09-11 DIAGNOSIS — S6992XA Unspecified injury of left wrist, hand and finger(s), initial encounter: Secondary | ICD-10-CM | POA: Diagnosis not present

## 2021-09-11 DIAGNOSIS — Z23 Encounter for immunization: Secondary | ICD-10-CM | POA: Diagnosis not present

## 2021-09-11 DIAGNOSIS — R0689 Other abnormalities of breathing: Secondary | ICD-10-CM | POA: Diagnosis not present

## 2021-09-11 DIAGNOSIS — S61412A Laceration without foreign body of left hand, initial encounter: Secondary | ICD-10-CM | POA: Diagnosis not present

## 2021-09-11 DIAGNOSIS — M7989 Other specified soft tissue disorders: Secondary | ICD-10-CM | POA: Diagnosis not present

## 2021-09-11 DIAGNOSIS — I10 Essential (primary) hypertension: Secondary | ICD-10-CM | POA: Diagnosis not present

## 2021-09-11 DIAGNOSIS — J449 Chronic obstructive pulmonary disease, unspecified: Secondary | ICD-10-CM | POA: Diagnosis not present

## 2021-09-19 DIAGNOSIS — T148XXA Other injury of unspecified body region, initial encounter: Secondary | ICD-10-CM | POA: Diagnosis not present

## 2021-09-19 DIAGNOSIS — F17210 Nicotine dependence, cigarettes, uncomplicated: Secondary | ICD-10-CM | POA: Diagnosis not present

## 2021-09-19 DIAGNOSIS — R42 Dizziness and giddiness: Secondary | ICD-10-CM | POA: Diagnosis not present

## 2021-09-19 DIAGNOSIS — I10 Essential (primary) hypertension: Secondary | ICD-10-CM | POA: Diagnosis not present

## 2021-09-19 DIAGNOSIS — E1165 Type 2 diabetes mellitus with hyperglycemia: Secondary | ICD-10-CM | POA: Diagnosis not present

## 2021-09-19 DIAGNOSIS — I5032 Chronic diastolic (congestive) heart failure: Secondary | ICD-10-CM | POA: Diagnosis not present

## 2021-09-19 DIAGNOSIS — Z23 Encounter for immunization: Secondary | ICD-10-CM | POA: Diagnosis not present

## 2021-09-19 DIAGNOSIS — Z299 Encounter for prophylactic measures, unspecified: Secondary | ICD-10-CM | POA: Diagnosis not present

## 2021-09-26 DIAGNOSIS — E1165 Type 2 diabetes mellitus with hyperglycemia: Secondary | ICD-10-CM | POA: Diagnosis not present

## 2021-10-11 DIAGNOSIS — R0689 Other abnormalities of breathing: Secondary | ICD-10-CM | POA: Diagnosis not present

## 2021-10-11 DIAGNOSIS — R0602 Shortness of breath: Secondary | ICD-10-CM | POA: Diagnosis not present

## 2021-10-11 DIAGNOSIS — J449 Chronic obstructive pulmonary disease, unspecified: Secondary | ICD-10-CM | POA: Diagnosis not present

## 2021-10-27 DIAGNOSIS — E1165 Type 2 diabetes mellitus with hyperglycemia: Secondary | ICD-10-CM | POA: Diagnosis not present

## 2021-10-28 DIAGNOSIS — J449 Chronic obstructive pulmonary disease, unspecified: Secondary | ICD-10-CM | POA: Diagnosis not present

## 2021-10-28 DIAGNOSIS — G4733 Obstructive sleep apnea (adult) (pediatric): Secondary | ICD-10-CM | POA: Diagnosis not present

## 2021-11-11 DIAGNOSIS — J449 Chronic obstructive pulmonary disease, unspecified: Secondary | ICD-10-CM | POA: Diagnosis not present

## 2021-11-11 DIAGNOSIS — R0602 Shortness of breath: Secondary | ICD-10-CM | POA: Diagnosis not present

## 2021-11-11 DIAGNOSIS — R0689 Other abnormalities of breathing: Secondary | ICD-10-CM | POA: Diagnosis not present

## 2021-11-26 DIAGNOSIS — E1165 Type 2 diabetes mellitus with hyperglycemia: Secondary | ICD-10-CM | POA: Diagnosis not present

## 2021-12-01 DIAGNOSIS — F17200 Nicotine dependence, unspecified, uncomplicated: Secondary | ICD-10-CM | POA: Diagnosis not present

## 2021-12-01 DIAGNOSIS — J418 Mixed simple and mucopurulent chronic bronchitis: Secondary | ICD-10-CM | POA: Diagnosis not present

## 2021-12-01 DIAGNOSIS — J984 Other disorders of lung: Secondary | ICD-10-CM | POA: Diagnosis not present

## 2021-12-01 DIAGNOSIS — Z9911 Dependence on respirator [ventilator] status: Secondary | ICD-10-CM | POA: Diagnosis not present

## 2021-12-01 DIAGNOSIS — J449 Chronic obstructive pulmonary disease, unspecified: Secondary | ICD-10-CM | POA: Diagnosis not present

## 2021-12-01 DIAGNOSIS — G4733 Obstructive sleep apnea (adult) (pediatric): Secondary | ICD-10-CM | POA: Diagnosis not present

## 2021-12-11 DIAGNOSIS — R0689 Other abnormalities of breathing: Secondary | ICD-10-CM | POA: Diagnosis not present

## 2021-12-11 DIAGNOSIS — J449 Chronic obstructive pulmonary disease, unspecified: Secondary | ICD-10-CM | POA: Diagnosis not present

## 2021-12-11 DIAGNOSIS — R0602 Shortness of breath: Secondary | ICD-10-CM | POA: Diagnosis not present

## 2021-12-26 DIAGNOSIS — E1165 Type 2 diabetes mellitus with hyperglycemia: Secondary | ICD-10-CM | POA: Diagnosis not present

## 2022-01-11 DIAGNOSIS — R0689 Other abnormalities of breathing: Secondary | ICD-10-CM | POA: Diagnosis not present

## 2022-01-11 DIAGNOSIS — J449 Chronic obstructive pulmonary disease, unspecified: Secondary | ICD-10-CM | POA: Diagnosis not present

## 2022-01-11 DIAGNOSIS — R0602 Shortness of breath: Secondary | ICD-10-CM | POA: Diagnosis not present

## 2022-01-13 DIAGNOSIS — I10 Essential (primary) hypertension: Secondary | ICD-10-CM | POA: Diagnosis not present

## 2022-01-13 DIAGNOSIS — F17210 Nicotine dependence, cigarettes, uncomplicated: Secondary | ICD-10-CM | POA: Diagnosis not present

## 2022-01-13 DIAGNOSIS — G4733 Obstructive sleep apnea (adult) (pediatric): Secondary | ICD-10-CM | POA: Diagnosis not present

## 2022-01-13 DIAGNOSIS — J449 Chronic obstructive pulmonary disease, unspecified: Secondary | ICD-10-CM | POA: Diagnosis not present

## 2022-01-25 DIAGNOSIS — E1165 Type 2 diabetes mellitus with hyperglycemia: Secondary | ICD-10-CM | POA: Diagnosis not present

## 2022-02-11 DIAGNOSIS — R0689 Other abnormalities of breathing: Secondary | ICD-10-CM | POA: Diagnosis not present

## 2022-02-11 DIAGNOSIS — R0602 Shortness of breath: Secondary | ICD-10-CM | POA: Diagnosis not present

## 2022-02-11 DIAGNOSIS — J449 Chronic obstructive pulmonary disease, unspecified: Secondary | ICD-10-CM | POA: Diagnosis not present

## 2022-02-19 DIAGNOSIS — G4733 Obstructive sleep apnea (adult) (pediatric): Secondary | ICD-10-CM | POA: Diagnosis not present

## 2022-02-24 DIAGNOSIS — E1165 Type 2 diabetes mellitus with hyperglycemia: Secondary | ICD-10-CM | POA: Diagnosis not present

## 2022-03-10 DIAGNOSIS — J441 Chronic obstructive pulmonary disease with (acute) exacerbation: Secondary | ICD-10-CM | POA: Diagnosis not present

## 2022-03-10 DIAGNOSIS — E1129 Type 2 diabetes mellitus with other diabetic kidney complication: Secondary | ICD-10-CM | POA: Diagnosis not present

## 2022-03-10 DIAGNOSIS — E1165 Type 2 diabetes mellitus with hyperglycemia: Secondary | ICD-10-CM | POA: Diagnosis not present

## 2022-03-10 DIAGNOSIS — R809 Proteinuria, unspecified: Secondary | ICD-10-CM | POA: Diagnosis not present

## 2022-03-10 DIAGNOSIS — Z299 Encounter for prophylactic measures, unspecified: Secondary | ICD-10-CM | POA: Diagnosis not present

## 2022-03-10 DIAGNOSIS — I10 Essential (primary) hypertension: Secondary | ICD-10-CM | POA: Diagnosis not present

## 2022-03-11 DIAGNOSIS — J449 Chronic obstructive pulmonary disease, unspecified: Secondary | ICD-10-CM | POA: Diagnosis not present

## 2022-03-11 DIAGNOSIS — R0689 Other abnormalities of breathing: Secondary | ICD-10-CM | POA: Diagnosis not present

## 2022-03-11 DIAGNOSIS — R0602 Shortness of breath: Secondary | ICD-10-CM | POA: Diagnosis not present

## 2022-03-12 DIAGNOSIS — I10 Essential (primary) hypertension: Secondary | ICD-10-CM | POA: Diagnosis not present

## 2022-03-12 DIAGNOSIS — G4733 Obstructive sleep apnea (adult) (pediatric): Secondary | ICD-10-CM | POA: Diagnosis not present

## 2022-03-12 DIAGNOSIS — F17210 Nicotine dependence, cigarettes, uncomplicated: Secondary | ICD-10-CM | POA: Diagnosis not present

## 2022-03-18 DIAGNOSIS — E1165 Type 2 diabetes mellitus with hyperglycemia: Secondary | ICD-10-CM | POA: Diagnosis not present

## 2022-03-19 DIAGNOSIS — G4733 Obstructive sleep apnea (adult) (pediatric): Secondary | ICD-10-CM | POA: Diagnosis not present

## 2022-03-26 DIAGNOSIS — E1165 Type 2 diabetes mellitus with hyperglycemia: Secondary | ICD-10-CM | POA: Diagnosis not present

## 2022-04-11 DIAGNOSIS — J449 Chronic obstructive pulmonary disease, unspecified: Secondary | ICD-10-CM | POA: Diagnosis not present

## 2022-04-11 DIAGNOSIS — R0689 Other abnormalities of breathing: Secondary | ICD-10-CM | POA: Diagnosis not present

## 2022-04-11 DIAGNOSIS — R0602 Shortness of breath: Secondary | ICD-10-CM | POA: Diagnosis not present

## 2022-04-19 DIAGNOSIS — G4733 Obstructive sleep apnea (adult) (pediatric): Secondary | ICD-10-CM | POA: Diagnosis not present

## 2022-04-26 DIAGNOSIS — E1165 Type 2 diabetes mellitus with hyperglycemia: Secondary | ICD-10-CM | POA: Diagnosis not present

## 2022-05-11 DIAGNOSIS — R0602 Shortness of breath: Secondary | ICD-10-CM | POA: Diagnosis not present

## 2022-05-11 DIAGNOSIS — R0689 Other abnormalities of breathing: Secondary | ICD-10-CM | POA: Diagnosis not present

## 2022-05-11 DIAGNOSIS — J449 Chronic obstructive pulmonary disease, unspecified: Secondary | ICD-10-CM | POA: Diagnosis not present

## 2022-05-19 DIAGNOSIS — G4733 Obstructive sleep apnea (adult) (pediatric): Secondary | ICD-10-CM | POA: Diagnosis not present

## 2022-05-26 DIAGNOSIS — E1165 Type 2 diabetes mellitus with hyperglycemia: Secondary | ICD-10-CM | POA: Diagnosis not present

## 2022-06-11 DIAGNOSIS — R0602 Shortness of breath: Secondary | ICD-10-CM | POA: Diagnosis not present

## 2022-06-11 DIAGNOSIS — J449 Chronic obstructive pulmonary disease, unspecified: Secondary | ICD-10-CM | POA: Diagnosis not present

## 2022-06-11 DIAGNOSIS — R0689 Other abnormalities of breathing: Secondary | ICD-10-CM | POA: Diagnosis not present

## 2022-06-19 DIAGNOSIS — G4733 Obstructive sleep apnea (adult) (pediatric): Secondary | ICD-10-CM | POA: Diagnosis not present

## 2022-06-25 DIAGNOSIS — E1165 Type 2 diabetes mellitus with hyperglycemia: Secondary | ICD-10-CM | POA: Diagnosis not present

## 2022-07-11 DIAGNOSIS — R0689 Other abnormalities of breathing: Secondary | ICD-10-CM | POA: Diagnosis not present

## 2022-07-11 DIAGNOSIS — R0602 Shortness of breath: Secondary | ICD-10-CM | POA: Diagnosis not present

## 2022-07-11 DIAGNOSIS — J449 Chronic obstructive pulmonary disease, unspecified: Secondary | ICD-10-CM | POA: Diagnosis not present

## 2022-07-19 DIAGNOSIS — G4733 Obstructive sleep apnea (adult) (pediatric): Secondary | ICD-10-CM | POA: Diagnosis not present

## 2022-07-23 DIAGNOSIS — F17210 Nicotine dependence, cigarettes, uncomplicated: Secondary | ICD-10-CM | POA: Diagnosis not present

## 2022-07-23 DIAGNOSIS — M545 Low back pain, unspecified: Secondary | ICD-10-CM | POA: Diagnosis not present

## 2022-07-23 DIAGNOSIS — J449 Chronic obstructive pulmonary disease, unspecified: Secondary | ICD-10-CM | POA: Diagnosis not present

## 2022-07-23 DIAGNOSIS — E1165 Type 2 diabetes mellitus with hyperglycemia: Secondary | ICD-10-CM | POA: Diagnosis not present

## 2022-07-23 DIAGNOSIS — I209 Angina pectoris, unspecified: Secondary | ICD-10-CM | POA: Diagnosis not present

## 2022-07-23 DIAGNOSIS — Z299 Encounter for prophylactic measures, unspecified: Secondary | ICD-10-CM | POA: Diagnosis not present

## 2022-07-23 DIAGNOSIS — I10 Essential (primary) hypertension: Secondary | ICD-10-CM | POA: Diagnosis not present

## 2022-07-27 DIAGNOSIS — E1165 Type 2 diabetes mellitus with hyperglycemia: Secondary | ICD-10-CM | POA: Diagnosis not present

## 2022-08-03 DIAGNOSIS — E1165 Type 2 diabetes mellitus with hyperglycemia: Secondary | ICD-10-CM | POA: Diagnosis not present

## 2022-08-03 DIAGNOSIS — Z0000 Encounter for general adult medical examination without abnormal findings: Secondary | ICD-10-CM | POA: Diagnosis not present

## 2022-08-03 DIAGNOSIS — F17210 Nicotine dependence, cigarettes, uncomplicated: Secondary | ICD-10-CM | POA: Diagnosis not present

## 2022-08-03 DIAGNOSIS — I209 Angina pectoris, unspecified: Secondary | ICD-10-CM | POA: Diagnosis not present

## 2022-08-03 DIAGNOSIS — Z299 Encounter for prophylactic measures, unspecified: Secondary | ICD-10-CM | POA: Diagnosis not present

## 2022-08-03 DIAGNOSIS — I10 Essential (primary) hypertension: Secondary | ICD-10-CM | POA: Diagnosis not present

## 2022-08-03 DIAGNOSIS — Z7189 Other specified counseling: Secondary | ICD-10-CM | POA: Diagnosis not present

## 2022-08-04 DIAGNOSIS — E559 Vitamin D deficiency, unspecified: Secondary | ICD-10-CM | POA: Diagnosis not present

## 2022-08-04 DIAGNOSIS — R252 Cramp and spasm: Secondary | ICD-10-CM | POA: Diagnosis not present

## 2022-08-11 DIAGNOSIS — R0602 Shortness of breath: Secondary | ICD-10-CM | POA: Diagnosis not present

## 2022-08-11 DIAGNOSIS — J449 Chronic obstructive pulmonary disease, unspecified: Secondary | ICD-10-CM | POA: Diagnosis not present

## 2022-08-11 DIAGNOSIS — R0689 Other abnormalities of breathing: Secondary | ICD-10-CM | POA: Diagnosis not present

## 2022-08-19 DIAGNOSIS — G4733 Obstructive sleep apnea (adult) (pediatric): Secondary | ICD-10-CM | POA: Diagnosis not present

## 2022-08-25 DIAGNOSIS — H35033 Hypertensive retinopathy, bilateral: Secondary | ICD-10-CM | POA: Diagnosis not present

## 2022-08-25 DIAGNOSIS — H524 Presbyopia: Secondary | ICD-10-CM | POA: Diagnosis not present

## 2022-08-27 DIAGNOSIS — M542 Cervicalgia: Secondary | ICD-10-CM | POA: Diagnosis not present

## 2022-09-11 DIAGNOSIS — R0689 Other abnormalities of breathing: Secondary | ICD-10-CM | POA: Diagnosis not present

## 2022-09-11 DIAGNOSIS — J449 Chronic obstructive pulmonary disease, unspecified: Secondary | ICD-10-CM | POA: Diagnosis not present

## 2022-09-11 DIAGNOSIS — R0602 Shortness of breath: Secondary | ICD-10-CM | POA: Diagnosis not present

## 2022-09-19 DIAGNOSIS — G4733 Obstructive sleep apnea (adult) (pediatric): Secondary | ICD-10-CM | POA: Diagnosis not present

## 2022-09-25 DIAGNOSIS — J441 Chronic obstructive pulmonary disease with (acute) exacerbation: Secondary | ICD-10-CM | POA: Diagnosis not present

## 2022-10-08 DIAGNOSIS — E1165 Type 2 diabetes mellitus with hyperglycemia: Secondary | ICD-10-CM | POA: Diagnosis not present

## 2022-10-08 DIAGNOSIS — F17210 Nicotine dependence, cigarettes, uncomplicated: Secondary | ICD-10-CM | POA: Diagnosis not present

## 2022-10-08 DIAGNOSIS — I10 Essential (primary) hypertension: Secondary | ICD-10-CM | POA: Diagnosis not present

## 2022-10-08 DIAGNOSIS — J449 Chronic obstructive pulmonary disease, unspecified: Secondary | ICD-10-CM | POA: Diagnosis not present

## 2022-10-08 DIAGNOSIS — M545 Low back pain, unspecified: Secondary | ICD-10-CM | POA: Diagnosis not present

## 2022-10-11 DIAGNOSIS — R0689 Other abnormalities of breathing: Secondary | ICD-10-CM | POA: Diagnosis not present

## 2022-10-11 DIAGNOSIS — J449 Chronic obstructive pulmonary disease, unspecified: Secondary | ICD-10-CM | POA: Diagnosis not present

## 2022-10-11 DIAGNOSIS — R0602 Shortness of breath: Secondary | ICD-10-CM | POA: Diagnosis not present

## 2022-10-19 DIAGNOSIS — G4733 Obstructive sleep apnea (adult) (pediatric): Secondary | ICD-10-CM | POA: Diagnosis not present

## 2022-10-25 DIAGNOSIS — J441 Chronic obstructive pulmonary disease with (acute) exacerbation: Secondary | ICD-10-CM | POA: Diagnosis not present

## 2022-10-28 ENCOUNTER — Encounter: Payer: Self-pay | Admitting: Internal Medicine

## 2022-10-28 ENCOUNTER — Ambulatory Visit: Payer: Medicare Other | Attending: Internal Medicine | Admitting: Internal Medicine

## 2022-10-28 VITALS — BP 131/61 | HR 84 | Ht 71.0 in | Wt 198.2 lb

## 2022-10-28 DIAGNOSIS — I10 Essential (primary) hypertension: Secondary | ICD-10-CM | POA: Diagnosis not present

## 2022-10-28 DIAGNOSIS — I25798 Atherosclerosis of other coronary artery bypass graft(s) with other forms of angina pectoris: Secondary | ICD-10-CM

## 2022-10-28 DIAGNOSIS — I5033 Acute on chronic diastolic (congestive) heart failure: Secondary | ICD-10-CM

## 2022-10-28 DIAGNOSIS — I5020 Unspecified systolic (congestive) heart failure: Secondary | ICD-10-CM | POA: Diagnosis not present

## 2022-10-28 DIAGNOSIS — Z79899 Other long term (current) drug therapy: Secondary | ICD-10-CM

## 2022-10-28 DIAGNOSIS — I5030 Unspecified diastolic (congestive) heart failure: Secondary | ICD-10-CM | POA: Insufficient documentation

## 2022-10-28 MED ORDER — VALSARTAN 320 MG PO TABS: 320.0000 mg | ORAL_TABLET | Freq: Every day | ORAL | 1 refills | Status: DC

## 2022-10-28 MED ORDER — BISOPROLOL FUMARATE 5 MG PO TABS: 5.0000 mg | ORAL_TABLET | Freq: Every day | ORAL | 1 refills | Status: DC

## 2022-10-28 MED ORDER — FUROSEMIDE 40 MG PO TABS: 40.0000 mg | ORAL_TABLET | Freq: Every day | ORAL | 1 refills | Status: DC

## 2022-10-28 NOTE — Patient Instructions (Addendum)
Medication Instructions:  Your physician has recommended you make the following change in your medication:  Stop actos Stop valsartan/hctz Stop carvedilol Start lasix 40 mg daily Start valsartan 320 mg daily Start bisoprolol 5 mg daily Continue other medications the same  Labwork: CMET & BNP tomorrow at [facility] Non-fasting  Testing/Procedures: none  Follow-Up: Your physician recommends that you schedule a follow-up appointment in: 1 month  Any Other Special Instructions Will Be Listed Below (If Applicable).  If you need a refill on your cardiac medications before your next appointment, please call your pharmacy.

## 2022-10-28 NOTE — Progress Notes (Addendum)
Cardiology Office Note  Date: 10/28/2022   ID: Warren Lee, DOB 1951/09/29, MRN [id number]  PCP:  Monico Blitz, MD  Cardiologist:  Chalmers Guest, MD Electrophysiologist:  None   Reason for Office Visit: Follow-up of CAD  History of Present Illness: Warren Lee is a 71 y.o. male known to have CAD s/p PTCA of diagonal in 1998 ([place] in 2003 showed no new obstructive disease), abnormal nuclear stress test in 2020 (small defect of mild severity in the apex, low risk study with no intervention), HTN, DM 2, HLD, severe COPD on 3 L of home oxygen. Patient was last seen by Dr. Candis Musa, [facility] cardiology in 2021. Accompanied by wife.  Patient reported DOE with minimal exertion (will to perform any of his daily activities without feeling out of breath) for 1 year associated with intermittent LE swelling (although wife denied the patient having any lower extremity swelling). Chest discomfort once a week lasting for 40 seconds but he did not have to take SL NTG so far. He endorsed having ongoing dizziness but denied any syncope. Denied palpitations. Compliant with medications and no side effects. He quit smoking 1 month after his hospitalization.  Past Medical History:  Diagnosis Date   Abdominal wall fistula    Anxiety    Chronic back pain    Chronic pancreatitis [facility])    CT July 2012: Chronic calcific pancreatitis without evidence of acute   Coronary artery disease    Diabetes mellitus    Heart disease    Hypercholesterolemia    Hypertension    Pancreatitis    S/P colonoscopy 2007   Dr. Lindalou Hose: normal    S/P endoscopy July 2012   normal   Shortness of breath    Systolic murmur     Past Surgical History:  Procedure Laterality Date   CARDIAC CATHETERIZATION  2003   CHEST TUBE INSERTION     CHOLECYSTECTOMY     with complications, prolonged hospitalization, with bile leak   ESOPHAGOGASTRODUODENOSCOPY  07/22/2011   Procedure: ESOPHAGOGASTRODUODENOSCOPY (EGD);  Surgeon: Daneil Dolin, MD;  Location[facility];  Service: Endoscopy;  Laterality: N/A;   exploratory laparotomy X 2     after complications with chole   PTCA  1998   TRACHEOSTOMY      Current Outpatient Medications  Medication Sig Dispense Refill   albuterol (PROVENTIL) (2.5 MG/3ML) 0.083% nebulizer solution SMARTSIG:1 Vial(s) Via Nebulizer 4 Times Daily PRN     albuterol (VENTOLIN HFA) 108 (90 Base) MCG/ACT inhaler Inhale 2 puffs into the lungs every 4 (four) hours as needed.     alfuzosin (UROXATRAL) 10 MG 24 hr tablet Take 10 mg by mouth daily.     aspirin EC 81 MG tablet Take 1 tablet (81 mg total) by mouth daily. 90 tablet 3   bisoprolol (ZEBETA) 5 MG tablet Take 1 tablet (5 mg total) by mouth daily. 30 tablet 1   DULoxetine (CYMBALTA) 60 MG capsule Take 1 tablet by mouth daily.     Fluticasone-Umeclidin-Vilant (TRELEGY ELLIPTA IN) Inhale 1 puff into the lungs daily.     furosemide (LASIX) 40 MG tablet Take 1 tablet (40 mg total) by mouth daily. 30 tablet 1   gabapentin (NEURONTIN) 300 MG capsule Take 300 mg by mouth 2 (two) times daily.     glimepiride (AMARYL) 4 MG tablet Take 4 mg by mouth daily.     metFORMIN (GLUCOPHAGE) 850 MG tablet Take 850 mg by mouth 2 (two) times daily with a  meal.     Multiple Vitamin (MULTIVITAMIN WITH MINERALS) TABS tablet Take 1 tablet by mouth daily.     nitroGLYCERIN (NITROSTAT) 0.4 MG SL tablet Place 1 tablet under the tongue every 5 (five) minutes x 3 doses as needed.     PARoxetine (PAXIL) 20 MG tablet Take 20 mg by mouth daily.      rosuvastatin (CRESTOR) 10 MG tablet Take 10 mg by mouth at bedtime.     spironolactone (ALDACTONE) 50 MG tablet Take 50 mg by mouth daily.     TRESIBA FLEXTOUCH 100 UNIT/ML FlexTouch Pen Inject 50 Units into the skin in the morning and at bedtime.     valsartan (DIOVAN) 320 MG tablet Take 1 tablet (320 mg total) by mouth daily. 30 tablet 1   No current facility-administered medications for this visit.   Allergies:  Patient  has no known allergies.   Social History: The patient  reports that he has been smoking cigarettes. He has a 60.00 pack-year smoking history. He has never used smokeless tobacco. He reports that he does not drink alcohol and does not use drugs.   Family History: The patient's family history includes Heart failure in his father.   ROS:  Please see the history of present illness. Otherwise, complete review of systems is positive for none.  All other systems are reviewed and negative.   Physical Exam: VS:  BP 131/61 (BP Location: Right Arm, Cuff Size: Normal)   Pulse 84   Ht '5\' 11"'$  (1.803 m)   Wt 198 lb 3.2 oz (89.9 kg)   SpO2 93% Comment: 2 L O2  BMI 27.64 kg/m , BMI Body mass index is 27.64 kg/m.  Wt Readings from Last 3 Encounters:  10/28/22 198 lb 3.2 oz (89.9 kg)  12/19/19 210 lb (95.3 kg)  09/22/19 206 lb (93.4 kg)    General: Patient appears comfortable at rest. HEENT: Conjunctiva and lids normal, oropharynx clear with moist mucosa. Neck: Supple, not able to lay flat  Lungs: Clear to auscultation, nonlabored breathing at rest. Cardiac: Regular rate and rhythm, no S3 or significant systolic murmur, no pericardial rub. Abdomen: Soft, nontender, no hepatomegaly, bowel sounds present, no guarding or rebound. Extremities: No pitting edema, distal pulses 2+. Skin: Warm and dry. Musculoskeletal: No kyphosis. Neuropsychiatric: Alert and oriented x3, affect grossly appropriate.  ECG:  An ECG dated 10/28/2022 was personally reviewed today and demonstrated:  NSR  Recent Labwork: No results found for requested labs within last 365 days.     Component Value Date/Time   CHOL 111 09/29/2013 0804   TRIG 95 09/29/2013 0804   HDL 32 (L) 09/29/2013 0804   CHOLHDL 3.5 09/29/2013 0804   VLDL 19 09/29/2013 0804   LDLCALC 60 09/29/2013 0804    Other Studies Reviewed Today: Echo in April 2021 Summary    1. Technically difficult study due to patient position and body habitus.    2.  Echo contrast utilized to enhance endocardial border definition.    3. The left ventricle is normal in size with mildly increased wall  thickness.    4. The left ventricular systolic function is normal, LVEF is visually  estimated at 65-70%.    5. There is grade I diastolic dysfunction (impaired relaxation).    6. The left atrium is moderately dilated in size.    7. The right ventricle is normal in size, with normal systolic function.   Assessment and Plan: Patient is a 71 year old M known to have CAD s/p PTCA  of diagonal in 1998 ([place] in 2003 showed no new obstructive disease), abnormal nuclear stress test in 2020 (small defect of mild severity in the apex, low risk study with no intervention), HTN, DM 2, HLD, severe COPD on 3 L of home oxygen. Patient was last seen by Dr. Candis Musa, [facility] cardiology in 2021. Accompanied by wife.  #HFpEF Plan -Patient was euvolemic per cardiology note in 2021 and he had grade 1 diastolic dysfunction on 5885 echo.  However in the last 1 year, he has SOB even with minimal exertion and cannot perform his daily activities without feeling out of breath. I suspect if his current medication pioglitazone is worsening his HFpEF. Stop pioglitazone. -Obtain 2D Echo to evaluate for any newly decreased systolic function -Start Lasix 40 mg once daily and stop HCTZ 12.'5mg'$  once daily -Obtain BNP and CMP  #CAD s/p PTCA of diagonal in 1998 ([facility] in 2003 showed no new obstructive disease), abnormal nuclear stress test in 2020 (small defect of mild severity in the apex, low risk study with no intervention), currently angina free Plan -Continue aspirin 81 mg once daily and rosuvastatin 10 mg nightly -Patient currently angina free. However it is unclear at this time if this DOE is an anginal equivalent or HFpEF.  We will treat him with Lasix and see him in a month. If he continues to have DOE despite being on Lasix, will obtain pharmacological nuclear stress test after euvolemic. -Switch  carvedilol to bisoprolol 5 mg once daily and continue valsartan 320 mg once daily  #HTN, controlled Plan -Continue valsartan 320 mg once daily, Aldactone 50 mg once daily, Lasix 40 mg once daily and bisoprolol 5 mg once daily. If patient continues to have dizziness, we will need to cut back on spironolactone or/and valsartan.  Instructed patient to check his blood pressures every day in the morning time and keep a log of the blood pressure readings.  #Severe COPD on 3 L of home oxygen Plan -Continue follow-up with pulmonology  I have spent a total of 33 minutes with patient reviewing chart , telemetry, EKGs, labs and examining patient as well as establishing an assessment and plan that was discussed with the patient.  > 50% of time was spent in direct patient care.      Medication Adjustments/Labs and Tests Ordered: Current medicines are reviewed at length with the patient today.  Concerns regarding medicines are outlined above.   Tests Ordered: Orders Placed This Encounter  Procedures   Comprehensive metabolic panel   Brain natriuretic peptide   EKG 12-Lead    Medication Changes: Meds ordered this encounter  Medications   furosemide (LASIX) 40 MG tablet    Sig: Take 1 tablet (40 mg total) by mouth daily.    Dispense:  30 tablet    Refill:  1    10/28/2022 NEW   valsartan (DIOVAN) 320 MG tablet    Sig: Take 1 tablet (320 mg total) by mouth daily.    Dispense:  30 tablet    Refill:  1    10/28/2022 NEW   bisoprolol (ZEBETA) 5 MG tablet    Sig: Take 1 tablet (5 mg total) by mouth daily.    Dispense:  30 tablet    Refill:  1    10/28/2022 NEW    Disposition:  Follow up  1 month  Signed Chancie Lampert Fidel Levy, MD, 10/28/2022 5:03 PM    [address], [place], [place] [address]

## 2022-10-29 DIAGNOSIS — R601 Generalized edema: Secondary | ICD-10-CM | POA: Diagnosis not present

## 2022-10-29 DIAGNOSIS — I25798 Atherosclerosis of other coronary artery bypass graft(s) with other forms of angina pectoris: Secondary | ICD-10-CM | POA: Diagnosis not present

## 2022-11-02 DIAGNOSIS — E8729 Other acidosis: Secondary | ICD-10-CM | POA: Diagnosis not present

## 2022-11-02 DIAGNOSIS — E119 Type 2 diabetes mellitus without complications: Secondary | ICD-10-CM | POA: Diagnosis not present

## 2022-11-02 DIAGNOSIS — Z87891 Personal history of nicotine dependence: Secondary | ICD-10-CM | POA: Diagnosis not present

## 2022-11-02 DIAGNOSIS — E875 Hyperkalemia: Secondary | ICD-10-CM | POA: Diagnosis not present

## 2022-11-02 DIAGNOSIS — J9622 Acute and chronic respiratory failure with hypercapnia: Secondary | ICD-10-CM | POA: Diagnosis not present

## 2022-11-02 DIAGNOSIS — Z7951 Long term (current) use of inhaled steroids: Secondary | ICD-10-CM | POA: Diagnosis not present

## 2022-11-02 DIAGNOSIS — I110 Hypertensive heart disease with heart failure: Secondary | ICD-10-CM | POA: Diagnosis not present

## 2022-11-02 DIAGNOSIS — I517 Cardiomegaly: Secondary | ICD-10-CM | POA: Diagnosis not present

## 2022-11-02 DIAGNOSIS — Z79899 Other long term (current) drug therapy: Secondary | ICD-10-CM | POA: Diagnosis not present

## 2022-11-02 DIAGNOSIS — I5032 Chronic diastolic (congestive) heart failure: Secondary | ICD-10-CM | POA: Diagnosis not present

## 2022-11-02 DIAGNOSIS — Z7984 Long term (current) use of oral hypoglycemic drugs: Secondary | ICD-10-CM | POA: Diagnosis not present

## 2022-11-02 DIAGNOSIS — I6523 Occlusion and stenosis of bilateral carotid arteries: Secondary | ICD-10-CM | POA: Diagnosis not present

## 2022-11-02 DIAGNOSIS — F32A Depression, unspecified: Secondary | ICD-10-CM | POA: Diagnosis not present

## 2022-11-02 DIAGNOSIS — G4733 Obstructive sleep apnea (adult) (pediatric): Secondary | ICD-10-CM | POA: Diagnosis not present

## 2022-11-02 DIAGNOSIS — J441 Chronic obstructive pulmonary disease with (acute) exacerbation: Secondary | ICD-10-CM | POA: Diagnosis not present

## 2022-11-02 DIAGNOSIS — I2510 Atherosclerotic heart disease of native coronary artery without angina pectoris: Secondary | ICD-10-CM | POA: Diagnosis not present

## 2022-11-02 DIAGNOSIS — Z7982 Long term (current) use of aspirin: Secondary | ICD-10-CM | POA: Diagnosis not present

## 2022-11-02 DIAGNOSIS — J449 Chronic obstructive pulmonary disease, unspecified: Secondary | ICD-10-CM | POA: Diagnosis not present

## 2022-11-02 DIAGNOSIS — Z9989 Dependence on other enabling machines and devices: Secondary | ICD-10-CM | POA: Diagnosis not present

## 2022-11-02 DIAGNOSIS — J9621 Acute and chronic respiratory failure with hypoxia: Secondary | ICD-10-CM | POA: Diagnosis not present

## 2022-11-02 NOTE — Addendum Note (Signed)
Addended by: Vangie Bicker on: 11/02/2022 01:39 PM   Modules accepted: Orders

## 2022-11-03 DIAGNOSIS — E119 Type 2 diabetes mellitus without complications: Secondary | ICD-10-CM | POA: Diagnosis not present

## 2022-11-03 DIAGNOSIS — J9621 Acute and chronic respiratory failure with hypoxia: Secondary | ICD-10-CM | POA: Diagnosis not present

## 2022-11-03 DIAGNOSIS — J9622 Acute and chronic respiratory failure with hypercapnia: Secondary | ICD-10-CM | POA: Diagnosis not present

## 2022-11-03 DIAGNOSIS — J449 Chronic obstructive pulmonary disease, unspecified: Secondary | ICD-10-CM | POA: Diagnosis not present

## 2022-11-03 DIAGNOSIS — I5032 Chronic diastolic (congestive) heart failure: Secondary | ICD-10-CM | POA: Diagnosis not present

## 2022-11-03 DIAGNOSIS — G4733 Obstructive sleep apnea (adult) (pediatric): Secondary | ICD-10-CM | POA: Diagnosis not present

## 2022-11-03 DIAGNOSIS — I2510 Atherosclerotic heart disease of native coronary artery without angina pectoris: Secondary | ICD-10-CM | POA: Diagnosis not present

## 2022-11-06 DIAGNOSIS — I10 Essential (primary) hypertension: Secondary | ICD-10-CM | POA: Diagnosis not present

## 2022-11-06 DIAGNOSIS — J449 Chronic obstructive pulmonary disease, unspecified: Secondary | ICD-10-CM | POA: Diagnosis not present

## 2022-11-06 DIAGNOSIS — Z299 Encounter for prophylactic measures, unspecified: Secondary | ICD-10-CM | POA: Diagnosis not present

## 2022-11-06 DIAGNOSIS — E1165 Type 2 diabetes mellitus with hyperglycemia: Secondary | ICD-10-CM | POA: Diagnosis not present

## 2022-11-06 DIAGNOSIS — F17210 Nicotine dependence, cigarettes, uncomplicated: Secondary | ICD-10-CM | POA: Diagnosis not present

## 2022-11-06 DIAGNOSIS — Z1211 Encounter for screening for malignant neoplasm of colon: Secondary | ICD-10-CM | POA: Diagnosis not present

## 2022-11-09 DIAGNOSIS — J449 Chronic obstructive pulmonary disease, unspecified: Secondary | ICD-10-CM | POA: Diagnosis not present

## 2022-11-09 DIAGNOSIS — R0602 Shortness of breath: Secondary | ICD-10-CM | POA: Diagnosis not present

## 2022-11-11 DIAGNOSIS — R0602 Shortness of breath: Secondary | ICD-10-CM | POA: Diagnosis not present

## 2022-11-11 DIAGNOSIS — R0689 Other abnormalities of breathing: Secondary | ICD-10-CM | POA: Diagnosis not present

## 2022-11-11 DIAGNOSIS — J449 Chronic obstructive pulmonary disease, unspecified: Secondary | ICD-10-CM | POA: Diagnosis not present

## 2022-11-19 DIAGNOSIS — G4733 Obstructive sleep apnea (adult) (pediatric): Secondary | ICD-10-CM | POA: Diagnosis not present

## 2022-11-21 ENCOUNTER — Other Ambulatory Visit: Payer: Self-pay | Admitting: Internal Medicine

## 2022-11-23 DIAGNOSIS — R9389 Abnormal findings on diagnostic imaging of other specified body structures: Secondary | ICD-10-CM | POA: Diagnosis not present

## 2022-11-25 DIAGNOSIS — E1165 Type 2 diabetes mellitus with hyperglycemia: Secondary | ICD-10-CM | POA: Diagnosis not present

## 2022-11-25 DIAGNOSIS — J441 Chronic obstructive pulmonary disease with (acute) exacerbation: Secondary | ICD-10-CM | POA: Diagnosis not present

## 2022-12-01 ENCOUNTER — Telehealth: Payer: Self-pay | Admitting: Internal Medicine

## 2022-12-01 DIAGNOSIS — J439 Emphysema, unspecified: Secondary | ICD-10-CM | POA: Diagnosis not present

## 2022-12-01 DIAGNOSIS — R0600 Dyspnea, unspecified: Secondary | ICD-10-CM | POA: Diagnosis not present

## 2022-12-01 DIAGNOSIS — J449 Chronic obstructive pulmonary disease, unspecified: Secondary | ICD-10-CM | POA: Diagnosis not present

## 2022-12-01 DIAGNOSIS — J9622 Acute and chronic respiratory failure with hypercapnia: Secondary | ICD-10-CM | POA: Diagnosis not present

## 2022-12-01 DIAGNOSIS — R9389 Abnormal findings on diagnostic imaging of other specified body structures: Secondary | ICD-10-CM | POA: Diagnosis not present

## 2022-12-01 NOTE — Telephone Encounter (Signed)
checking on patient [id number] His echo was scheduled for [facility] (it has been authorized) but we are switching the location to [facility].  Will this affect the percert?

## 2022-12-03 ENCOUNTER — Ambulatory Visit: Payer: Medicare Other | Admitting: Internal Medicine

## 2022-12-08 ENCOUNTER — Ambulatory Visit (HOSPITAL_COMMUNITY)
Admission: RE | Admit: 2022-12-08 | Discharge: 2022-12-08 | Disposition: A | Discharge status: Home / Self Care | Payer: Medicare Other | Source: Ambulatory Visit | Attending: Internal Medicine | Admitting: Internal Medicine

## 2022-12-08 DIAGNOSIS — I358 Other nonrheumatic aortic valve disorders: Secondary | ICD-10-CM | POA: Insufficient documentation

## 2022-12-08 DIAGNOSIS — R0600 Dyspnea, unspecified: Secondary | ICD-10-CM | POA: Diagnosis not present

## 2022-12-08 DIAGNOSIS — I5033 Acute on chronic diastolic (congestive) heart failure: Secondary | ICD-10-CM

## 2022-12-08 DIAGNOSIS — R0609 Other forms of dyspnea: Secondary | ICD-10-CM

## 2022-12-08 LAB — ECHOCARDIOGRAM COMPLETE
AR max vel: 3.12 cm2
AV Area VTI: 2.36 cm2
AV Area mean vel: 3.16 cm2
AV Mean grad: 4 mmHg
AV Peak grad: 8.4 mmHg
Ao pk vel: 1.45 m/s
Area-P 1/2: 3.4 cm2
MV VTI: 2 cm2
S' Lateral: 3.2 cm

## 2022-12-08 NOTE — Progress Notes (Signed)
*  PRELIMINARY RESULTS* Echocardiogram 2D Echocardiogram has been performed.  Warren Lee 12/08/2022, 2:07 PM

## 2022-12-09 ENCOUNTER — Other Ambulatory Visit: Payer: Self-pay | Admitting: Physical Medicine and Rehabilitation

## 2022-12-09 ENCOUNTER — Ambulatory Visit
Admission: RE | Admit: 2022-12-09 | Discharge: 2022-12-09 | Disposition: A | Discharge status: Home / Self Care | Payer: Medicare Other | Source: Ambulatory Visit | Attending: Physical Medicine and Rehabilitation | Admitting: Physical Medicine and Rehabilitation

## 2022-12-09 DIAGNOSIS — M47812 Spondylosis without myelopathy or radiculopathy, cervical region: Secondary | ICD-10-CM | POA: Diagnosis not present

## 2022-12-09 DIAGNOSIS — M47816 Spondylosis without myelopathy or radiculopathy, lumbar region: Secondary | ICD-10-CM | POA: Diagnosis not present

## 2022-12-09 DIAGNOSIS — M5440 Lumbago with sciatica, unspecified side: Secondary | ICD-10-CM | POA: Diagnosis not present

## 2022-12-09 DIAGNOSIS — M542 Cervicalgia: Secondary | ICD-10-CM | POA: Diagnosis not present

## 2022-12-09 DIAGNOSIS — I700 Atherosclerosis of aorta: Secondary | ICD-10-CM | POA: Diagnosis not present

## 2022-12-10 DIAGNOSIS — Z23 Encounter for immunization: Secondary | ICD-10-CM | POA: Diagnosis not present

## 2022-12-10 DIAGNOSIS — I10 Essential (primary) hypertension: Secondary | ICD-10-CM | POA: Diagnosis not present

## 2022-12-10 DIAGNOSIS — E1165 Type 2 diabetes mellitus with hyperglycemia: Secondary | ICD-10-CM | POA: Diagnosis not present

## 2022-12-10 DIAGNOSIS — Z299 Encounter for prophylactic measures, unspecified: Secondary | ICD-10-CM | POA: Diagnosis not present

## 2022-12-10 DIAGNOSIS — J9611 Chronic respiratory failure with hypoxia: Secondary | ICD-10-CM | POA: Diagnosis not present

## 2022-12-10 DIAGNOSIS — G4733 Obstructive sleep apnea (adult) (pediatric): Secondary | ICD-10-CM | POA: Diagnosis not present

## 2022-12-11 ENCOUNTER — Encounter: Payer: Self-pay | Admitting: Internal Medicine

## 2022-12-11 ENCOUNTER — Ambulatory Visit: Payer: Medicare Other | Attending: Internal Medicine | Admitting: Internal Medicine

## 2022-12-11 VITALS — BP 100/50 | HR 58 | Ht 71.0 in | Wt 202.0 lb

## 2022-12-11 DIAGNOSIS — J449 Chronic obstructive pulmonary disease, unspecified: Secondary | ICD-10-CM | POA: Diagnosis not present

## 2022-12-11 DIAGNOSIS — R0602 Shortness of breath: Secondary | ICD-10-CM | POA: Diagnosis not present

## 2022-12-11 DIAGNOSIS — R0689 Other abnormalities of breathing: Secondary | ICD-10-CM | POA: Diagnosis not present

## 2022-12-11 MED ORDER — VALSARTAN 160 MG PO TABS: 160.0000 mg | ORAL_TABLET | Freq: Every day | ORAL | 2 refills | Status: DC

## 2022-12-11 MED ORDER — NITROGLYCERIN 0.4 MG SL SUBL: 0.4000 mg | SUBLINGUAL_TABLET | SUBLINGUAL | 3 refills | Status: AC | PRN

## 2022-12-11 NOTE — Progress Notes (Signed)
Cardiology Office Note  Date: 12/11/2022   ID: Tedd Cottrill, DOB 1951-09-30, MRN [id number]  PCP:  Monico Blitz, MD  Cardiologist:  Chalmers Guest, MD Electrophysiologist:  None   Reason for Office Visit: Follow-up of CAD  History of Present Illness: Warren Lee is a 71 y.o. male known to have CAD s/p PTCA of diagonal in 1998 ([place] in 2003 showed no new obstructive disease), abnormal nuclear stress test in 2020 (small defect of mild severity in the apex, low risk study with no intervention), HTN, DM 2, HLD, severe COPD on 3 L of home oxygen. Patient was last seen by me in 09/2022. Accompanied by wife.  Patient was admitted to [facility] after my prior clinic visit with acute hypoxic hypercapnic respiratory failure secondary to COPD exacerbation, stabilized and discharged home. He presented for follow-up visit.  He continues to have baseline SOB and was told by his pulmonologist that he has advanced COPD. Currently on home oxygen. Reported having substernal chest discomfort lasting for a few minutes, twice in 2 weeks and resolve spontaneously.  No other symptoms like dizziness/lightness, LE swelling or syncope. Wife said he recently quit smoking and is extremely happy about it.  She brought bottles of medications with her which noted that he was taking both carvedilol and bisoprolol  Past Medical History:  Diagnosis Date   Abdominal wall fistula    Anxiety    Chronic back pain    Chronic pancreatitis [facility])    CT July 2012: Chronic calcific pancreatitis without evidence of acute   Coronary artery disease    Diabetes mellitus    Heart disease    Hypercholesterolemia    Hypertension    Pancreatitis    S/P colonoscopy 2007   Dr. Lindalou Hose: normal    S/P endoscopy July 2012   normal   Shortness of breath    Systolic murmur     Past Surgical History:  Procedure Laterality Date   CARDIAC CATHETERIZATION  2003   CHEST TUBE INSERTION     CHOLECYSTECTOMY     with  complications, prolonged hospitalization, with bile leak   ESOPHAGOGASTRODUODENOSCOPY  07/22/2011   Procedure: ESOPHAGOGASTRODUODENOSCOPY (EGD);  Surgeon: Daneil Dolin, MD;  Location: [facility];  Service: Endoscopy;  Laterality: N/A;   exploratory laparotomy X 2     after complications with chole   PTCA  1998   TRACHEOSTOMY      Current Outpatient Medications  Medication Sig Dispense Refill   albuterol (PROVENTIL) (2.5 MG/3ML) 0.083% nebulizer solution SMARTSIG:1 Vial(s) Via Nebulizer 4 Times Daily PRN     albuterol (VENTOLIN HFA) 108 (90 Base) MCG/ACT inhaler Inhale 2 puffs into the lungs every 4 (four) hours as needed.     alfuzosin (UROXATRAL) 10 MG 24 hr tablet Take 10 mg by mouth daily.     aspirin EC 81 MG tablet Take 1 tablet (81 mg total) by mouth daily. 90 tablet 3   bisoprolol (ZEBETA) 5 MG tablet Take 1 tablet (5 mg total) by mouth daily. 30 tablet 1   DULoxetine (CYMBALTA) 60 MG capsule Take 1 tablet by mouth daily.     Fluticasone-Umeclidin-Vilant (TRELEGY ELLIPTA IN) Inhale 1 puff into the lungs daily.     furosemide (LASIX) 40 MG tablet Take 1 tablet (40 mg total) by mouth daily. 30 tablet 1   gabapentin (NEURONTIN) 300 MG capsule Take 300 mg by mouth 2 (two) times daily.     glimepiride (AMARYL) 4 MG tablet Take 4 mg  by mouth daily.     metFORMIN (GLUCOPHAGE) 850 MG tablet Take 850 mg by mouth 2 (two) times daily with a meal.     Multiple Vitamin (MULTIVITAMIN WITH MINERALS) TABS tablet Take 1 tablet by mouth daily.     nitroGLYCERIN (NITROSTAT) 0.4 MG SL tablet Place 1 tablet under the tongue every 5 (five) minutes x 3 doses as needed.     PARoxetine (PAXIL) 20 MG tablet Take 20 mg by mouth daily.      rosuvastatin (CRESTOR) 10 MG tablet Take 10 mg by mouth at bedtime.     spironolactone (ALDACTONE) 50 MG tablet Take 50 mg by mouth daily.     TRESIBA FLEXTOUCH 100 UNIT/ML FlexTouch Pen Inject 50 Units into the skin in the morning and at bedtime.     valsartan  (DIOVAN) 320 MG tablet TAKE 1 TABLET BY MOUTH EVERY DAY 90 tablet 1   No current facility-administered medications for this visit.   Allergies:  Patient has no known allergies.   Social History: The patient  reports that he has been smoking cigarettes. He has a 60.00 pack-year smoking history. He has never used smokeless tobacco. He reports that he does not drink alcohol and does not use drugs.   Family History: The patient's family history includes Heart failure in his father.   ROS:  Please see the history of present illness. Otherwise, complete review of systems is positive for none.  All other systems are reviewed and negative.   Physical Exam: VS:  Ht '5\' 11"'$  (1.803 m)   Wt 202 lb (91.6 kg)   BMI 28.17 kg/m , BMI Body mass index is 28.17 kg/m.  Wt Readings from Last 3 Encounters:  12/11/22 202 lb (91.6 kg)  10/28/22 198 lb 3.2 oz (89.9 kg)  12/19/19 210 lb (95.3 kg)    General: Patient appears comfortable at rest. HEENT: Conjunctiva and lids normal, oropharynx clear with moist mucosa. Neck: Supple, not able to lay flat  Lungs: Clear to auscultation, nonlabored breathing at rest. Cardiac: Regular rate and rhythm, no S3 or significant systolic murmur, no pericardial rub. Abdomen: Soft, nontender, no hepatomegaly, bowel sounds present, no guarding or rebound. Extremities: No pitting edema, distal pulses 2+. Skin: Warm and dry. Musculoskeletal: No kyphosis. Neuropsychiatric: Alert and oriented x3, affect grossly appropriate.  ECG:  An ECG dated 10/28/2022 was personally reviewed today and demonstrated:  NSR  Recent Labwork: No results found for requested labs within last 365 days.     Component Value Date/Time   CHOL 111 09/29/2013 0804   TRIG 95 09/29/2013 0804   HDL 32 (L) 09/29/2013 0804   CHOLHDL 3.5 09/29/2013 0804   VLDL 19 09/29/2013 0804   LDLCALC 60 09/29/2013 0804    Other Studies Reviewed Today: Echo in April 2021 Summary    1. Technically difficult  study due to patient position and body habitus.    2. Echo contrast utilized to enhance endocardial border definition.    3. The left ventricle is normal in size with mildly increased wall  thickness.    4. The left ventricular systolic function is normal, LVEF is visually  estimated at 65-70%.    5. There is grade I diastolic dysfunction (impaired relaxation).    6. The left atrium is moderately dilated in size.    7. The right ventricle is normal in size, with normal systolic function.   Assessment and Plan: Patient is a 71 year old M known to have CAD s/p PTCA of diagonal in  1998 ([place] in 2003 showed no new obstructive disease), abnormal nuclear stress test in 2020 (small defect of mild severity in the apex, low risk study with no intervention), HTN, DM 2, HLD, severe COPD on 3 L of home oxygen. Patient was last seen by Dr. Candis Musa, [facility] cardiology in 2021. Accompanied by wife.  #HFpEF, compensated Plan -Continue Lasix 40 mg once daily  #CAD s/p PTCA of diagonal in 1998 ([facility] in 2003 showed no new obstructive disease), abnormal nuclear stress test in 2020 (small defect of mild severity in the apex, low risk study with no intervention), currently angina free Plan -Continue aspirin 81 mg once daily and rosuvastatin 10 mg nightly -Continue bisoprolol 5 mg once daily and decrease valsartan dose from 320 mg to 160 mg once daily -SL NTG 0.4 mg as needed  #HTN, controlled Plan -Stop carvedilol. Continue bisoprolol 5 mg once daily, Lasix 40 mg once daily, Aldactone 50 mg once daily and decrease dose of valsartan from 320 mg to 160 mg once daily.  #Severe COPD on 3 L of home oxygen Plan -Patient had a recent admission in 10/23 with acute hypoxic hypercapnic respiratory failure secondary to severe COPD exacerbation.  Currently stable with baseline shortness of breath and has no recent worsening of DOE after discharge from the hospital.  I have spent a total of 34 minutes with patient reviewing  chart , telemetry, EKGs, labs and examining patient as well as establishing an assessment and plan that was discussed with the patient.  > 50% of time was spent in direct patient care.      Medication Adjustments/Labs and Tests Ordered: Current medicines are reviewed at length with the patient today.  Concerns regarding medicines are outlined above.   Tests Ordered: No orders of the defined types were placed in this encounter.   Medication Changes: No orders of the defined types were placed in this encounter.   Disposition:  Follow up  6 month  Signed Yomayra Tate Fidel Levy, MD, 12/11/2022 9:34 AM    [facility] [place], [place], [place] [phone]

## 2022-12-11 NOTE — Patient Instructions (Addendum)
Medication Instructions:  Your physician has recommended you make the following change in your medication:  Stop carvedilol Decrease valsartan to 160 mg daily Continue other medications the same  Labwork: none  Testing/Procedures: none  Follow-Up: Your physician recommends that you schedule a follow-up appointment in: 6 months  Any Other Special Instructions Will Be Listed Below (If Applicable).  If you need a refill on your cardiac medications before your next appointment, please call your pharmacy.

## 2022-12-12 ENCOUNTER — Other Ambulatory Visit: Payer: Self-pay | Admitting: Internal Medicine

## 2022-12-12 DIAGNOSIS — I5020 Unspecified systolic (congestive) heart failure: Secondary | ICD-10-CM

## 2022-12-12 DIAGNOSIS — Z79899 Other long term (current) drug therapy: Secondary | ICD-10-CM

## 2022-12-12 DIAGNOSIS — I25798 Atherosclerosis of other coronary artery bypass graft(s) with other forms of angina pectoris: Secondary | ICD-10-CM

## 2022-12-14 ENCOUNTER — Other Ambulatory Visit: Payer: Medicare Other

## 2022-12-25 DIAGNOSIS — J441 Chronic obstructive pulmonary disease with (acute) exacerbation: Secondary | ICD-10-CM | POA: Diagnosis not present

## 2023-01-11 DIAGNOSIS — J449 Chronic obstructive pulmonary disease, unspecified: Secondary | ICD-10-CM | POA: Diagnosis not present

## 2023-01-11 DIAGNOSIS — R0689 Other abnormalities of breathing: Secondary | ICD-10-CM | POA: Diagnosis not present

## 2023-01-11 DIAGNOSIS — R0602 Shortness of breath: Secondary | ICD-10-CM | POA: Diagnosis not present

## 2023-01-12 DIAGNOSIS — M47816 Spondylosis without myelopathy or radiculopathy, lumbar region: Secondary | ICD-10-CM | POA: Diagnosis not present

## 2023-01-22 DIAGNOSIS — Z794 Long term (current) use of insulin: Secondary | ICD-10-CM | POA: Diagnosis not present

## 2023-01-22 DIAGNOSIS — E1165 Type 2 diabetes mellitus with hyperglycemia: Secondary | ICD-10-CM | POA: Diagnosis not present

## 2023-01-22 DIAGNOSIS — Z299 Encounter for prophylactic measures, unspecified: Secondary | ICD-10-CM | POA: Diagnosis not present

## 2023-01-22 DIAGNOSIS — I10 Essential (primary) hypertension: Secondary | ICD-10-CM | POA: Diagnosis not present

## 2023-01-25 DIAGNOSIS — J441 Chronic obstructive pulmonary disease with (acute) exacerbation: Secondary | ICD-10-CM | POA: Diagnosis not present

## 2023-02-11 DIAGNOSIS — R0689 Other abnormalities of breathing: Secondary | ICD-10-CM | POA: Diagnosis not present

## 2023-02-11 DIAGNOSIS — R0602 Shortness of breath: Secondary | ICD-10-CM | POA: Diagnosis not present

## 2023-02-11 DIAGNOSIS — J449 Chronic obstructive pulmonary disease, unspecified: Secondary | ICD-10-CM | POA: Diagnosis not present

## 2023-02-16 DIAGNOSIS — I10 Essential (primary) hypertension: Secondary | ICD-10-CM | POA: Diagnosis not present

## 2023-02-16 DIAGNOSIS — J9611 Chronic respiratory failure with hypoxia: Secondary | ICD-10-CM | POA: Diagnosis not present

## 2023-02-16 DIAGNOSIS — I5032 Chronic diastolic (congestive) heart failure: Secondary | ICD-10-CM | POA: Diagnosis not present

## 2023-02-16 DIAGNOSIS — Z299 Encounter for prophylactic measures, unspecified: Secondary | ICD-10-CM | POA: Diagnosis not present

## 2023-02-16 DIAGNOSIS — E1165 Type 2 diabetes mellitus with hyperglycemia: Secondary | ICD-10-CM | POA: Diagnosis not present

## 2023-02-18 DIAGNOSIS — E1165 Type 2 diabetes mellitus with hyperglycemia: Secondary | ICD-10-CM | POA: Diagnosis not present

## 2023-02-23 DIAGNOSIS — M47816 Spondylosis without myelopathy or radiculopathy, lumbar region: Secondary | ICD-10-CM | POA: Diagnosis not present

## 2023-03-12 DIAGNOSIS — R0689 Other abnormalities of breathing: Secondary | ICD-10-CM | POA: Diagnosis not present

## 2023-03-12 DIAGNOSIS — J449 Chronic obstructive pulmonary disease, unspecified: Secondary | ICD-10-CM | POA: Diagnosis not present

## 2023-03-12 DIAGNOSIS — R0602 Shortness of breath: Secondary | ICD-10-CM | POA: Diagnosis not present

## 2023-03-20 DIAGNOSIS — E1165 Type 2 diabetes mellitus with hyperglycemia: Secondary | ICD-10-CM | POA: Diagnosis not present

## 2023-04-07 DIAGNOSIS — E1165 Type 2 diabetes mellitus with hyperglycemia: Secondary | ICD-10-CM | POA: Diagnosis not present

## 2023-04-12 DIAGNOSIS — R0689 Other abnormalities of breathing: Secondary | ICD-10-CM | POA: Diagnosis not present

## 2023-04-12 DIAGNOSIS — R0602 Shortness of breath: Secondary | ICD-10-CM | POA: Diagnosis not present

## 2023-04-12 DIAGNOSIS — J449 Chronic obstructive pulmonary disease, unspecified: Secondary | ICD-10-CM | POA: Diagnosis not present

## 2023-04-19 DIAGNOSIS — E1165 Type 2 diabetes mellitus with hyperglycemia: Secondary | ICD-10-CM | POA: Diagnosis not present

## 2023-05-12 DIAGNOSIS — R0689 Other abnormalities of breathing: Secondary | ICD-10-CM | POA: Diagnosis not present

## 2023-05-12 DIAGNOSIS — R0602 Shortness of breath: Secondary | ICD-10-CM | POA: Diagnosis not present

## 2023-05-12 DIAGNOSIS — J449 Chronic obstructive pulmonary disease, unspecified: Secondary | ICD-10-CM | POA: Diagnosis not present

## 2023-05-19 DIAGNOSIS — E119 Type 2 diabetes mellitus without complications: Secondary | ICD-10-CM | POA: Diagnosis not present

## 2023-05-19 DIAGNOSIS — J9621 Acute and chronic respiratory failure with hypoxia: Secondary | ICD-10-CM | POA: Diagnosis not present

## 2023-05-19 DIAGNOSIS — I5189 Other ill-defined heart diseases: Secondary | ICD-10-CM | POA: Diagnosis not present

## 2023-05-19 DIAGNOSIS — E1140 Type 2 diabetes mellitus with diabetic neuropathy, unspecified: Secondary | ICD-10-CM | POA: Diagnosis not present

## 2023-05-19 DIAGNOSIS — I517 Cardiomegaly: Secondary | ICD-10-CM | POA: Diagnosis not present

## 2023-05-19 DIAGNOSIS — N179 Acute kidney failure, unspecified: Secondary | ICD-10-CM | POA: Diagnosis not present

## 2023-05-19 DIAGNOSIS — J439 Emphysema, unspecified: Secondary | ICD-10-CM | POA: Diagnosis not present

## 2023-05-19 DIAGNOSIS — Z299 Encounter for prophylactic measures, unspecified: Secondary | ICD-10-CM | POA: Diagnosis not present

## 2023-05-19 DIAGNOSIS — I700 Atherosclerosis of aorta: Secondary | ICD-10-CM | POA: Diagnosis not present

## 2023-05-19 DIAGNOSIS — Z7982 Long term (current) use of aspirin: Secondary | ICD-10-CM | POA: Diagnosis not present

## 2023-05-19 DIAGNOSIS — J9602 Acute respiratory failure with hypercapnia: Secondary | ICD-10-CM | POA: Diagnosis not present

## 2023-05-19 DIAGNOSIS — N183 Chronic kidney disease, stage 3 unspecified: Secondary | ICD-10-CM | POA: Diagnosis not present

## 2023-05-19 DIAGNOSIS — Z9049 Acquired absence of other specified parts of digestive tract: Secondary | ICD-10-CM | POA: Diagnosis not present

## 2023-05-19 DIAGNOSIS — F17210 Nicotine dependence, cigarettes, uncomplicated: Secondary | ICD-10-CM | POA: Diagnosis not present

## 2023-05-19 DIAGNOSIS — I10 Essential (primary) hypertension: Secondary | ICD-10-CM | POA: Diagnosis not present

## 2023-05-19 DIAGNOSIS — Z794 Long term (current) use of insulin: Secondary | ICD-10-CM | POA: Diagnosis not present

## 2023-05-19 DIAGNOSIS — I5033 Acute on chronic diastolic (congestive) heart failure: Secondary | ICD-10-CM | POA: Diagnosis not present

## 2023-05-19 DIAGNOSIS — E1165 Type 2 diabetes mellitus with hyperglycemia: Secondary | ICD-10-CM | POA: Diagnosis not present

## 2023-05-19 DIAGNOSIS — J441 Chronic obstructive pulmonary disease with (acute) exacerbation: Secondary | ICD-10-CM | POA: Diagnosis not present

## 2023-05-19 DIAGNOSIS — I110 Hypertensive heart disease with heart failure: Secondary | ICD-10-CM | POA: Diagnosis not present

## 2023-05-19 DIAGNOSIS — D649 Anemia, unspecified: Secondary | ICD-10-CM | POA: Diagnosis not present

## 2023-05-19 DIAGNOSIS — J449 Chronic obstructive pulmonary disease, unspecified: Secondary | ICD-10-CM | POA: Diagnosis not present

## 2023-05-19 DIAGNOSIS — Z9981 Dependence on supplemental oxygen: Secondary | ICD-10-CM | POA: Diagnosis not present

## 2023-05-19 DIAGNOSIS — R0602 Shortness of breath: Secondary | ICD-10-CM | POA: Diagnosis not present

## 2023-05-19 DIAGNOSIS — Z79899 Other long term (current) drug therapy: Secondary | ICD-10-CM | POA: Diagnosis not present

## 2023-05-19 DIAGNOSIS — I5032 Chronic diastolic (congestive) heart failure: Secondary | ICD-10-CM | POA: Diagnosis not present

## 2023-05-19 DIAGNOSIS — Z9989 Dependence on other enabling machines and devices: Secondary | ICD-10-CM | POA: Diagnosis not present

## 2023-05-19 DIAGNOSIS — J9601 Acute respiratory failure with hypoxia: Secondary | ICD-10-CM | POA: Diagnosis not present

## 2023-05-19 DIAGNOSIS — E785 Hyperlipidemia, unspecified: Secondary | ICD-10-CM | POA: Diagnosis not present

## 2023-05-19 DIAGNOSIS — Z7984 Long term (current) use of oral hypoglycemic drugs: Secondary | ICD-10-CM | POA: Diagnosis not present

## 2023-05-19 DIAGNOSIS — E1122 Type 2 diabetes mellitus with diabetic chronic kidney disease: Secondary | ICD-10-CM | POA: Diagnosis not present

## 2023-05-19 DIAGNOSIS — E875 Hyperkalemia: Secondary | ICD-10-CM | POA: Diagnosis not present

## 2023-05-19 DIAGNOSIS — R059 Cough, unspecified: Secondary | ICD-10-CM | POA: Diagnosis not present

## 2023-05-19 DIAGNOSIS — J9622 Acute and chronic respiratory failure with hypercapnia: Secondary | ICD-10-CM | POA: Diagnosis not present

## 2023-05-19 DIAGNOSIS — I130 Hypertensive heart and chronic kidney disease with heart failure and stage 1 through stage 4 chronic kidney disease, or unspecified chronic kidney disease: Secondary | ICD-10-CM | POA: Diagnosis not present

## 2023-05-19 DIAGNOSIS — F32A Depression, unspecified: Secondary | ICD-10-CM | POA: Diagnosis not present

## 2023-06-12 DIAGNOSIS — R0602 Shortness of breath: Secondary | ICD-10-CM | POA: Diagnosis not present

## 2023-06-12 DIAGNOSIS — R0689 Other abnormalities of breathing: Secondary | ICD-10-CM | POA: Diagnosis not present

## 2023-06-12 DIAGNOSIS — J449 Chronic obstructive pulmonary disease, unspecified: Secondary | ICD-10-CM | POA: Diagnosis not present

## 2023-06-15 ENCOUNTER — Ambulatory Visit: Payer: Medicare Other | Admitting: Internal Medicine

## 2023-06-24 ENCOUNTER — Other Ambulatory Visit: Payer: Self-pay | Admitting: Internal Medicine

## 2023-06-24 DIAGNOSIS — Z79899 Other long term (current) drug therapy: Secondary | ICD-10-CM

## 2023-06-24 DIAGNOSIS — I5020 Unspecified systolic (congestive) heart failure: Secondary | ICD-10-CM

## 2023-06-24 DIAGNOSIS — I25798 Atherosclerosis of other coronary artery bypass graft(s) with other forms of angina pectoris: Secondary | ICD-10-CM

## 2023-06-29 ENCOUNTER — Other Ambulatory Visit: Payer: Self-pay | Admitting: Internal Medicine

## 2023-06-29 DIAGNOSIS — Z299 Encounter for prophylactic measures, unspecified: Secondary | ICD-10-CM | POA: Diagnosis not present

## 2023-06-29 DIAGNOSIS — R609 Edema, unspecified: Secondary | ICD-10-CM | POA: Diagnosis not present

## 2023-06-29 DIAGNOSIS — J441 Chronic obstructive pulmonary disease with (acute) exacerbation: Secondary | ICD-10-CM | POA: Diagnosis not present

## 2023-06-29 DIAGNOSIS — I5032 Chronic diastolic (congestive) heart failure: Secondary | ICD-10-CM | POA: Diagnosis not present

## 2023-06-29 DIAGNOSIS — I10 Essential (primary) hypertension: Secondary | ICD-10-CM | POA: Diagnosis not present

## 2023-06-29 DIAGNOSIS — E1165 Type 2 diabetes mellitus with hyperglycemia: Secondary | ICD-10-CM | POA: Diagnosis not present

## 2023-06-29 LAB — HEMOGLOBIN A1C: Hemoglobin A1C: 11.2

## 2023-06-30 DIAGNOSIS — E1165 Type 2 diabetes mellitus with hyperglycemia: Secondary | ICD-10-CM | POA: Diagnosis not present

## 2023-06-30 DIAGNOSIS — Z299 Encounter for prophylactic measures, unspecified: Secondary | ICD-10-CM | POA: Diagnosis not present

## 2023-07-12 DIAGNOSIS — R0602 Shortness of breath: Secondary | ICD-10-CM | POA: Diagnosis not present

## 2023-07-12 DIAGNOSIS — R0689 Other abnormalities of breathing: Secondary | ICD-10-CM | POA: Diagnosis not present

## 2023-07-12 DIAGNOSIS — J449 Chronic obstructive pulmonary disease, unspecified: Secondary | ICD-10-CM | POA: Diagnosis not present

## 2023-07-22 DIAGNOSIS — I5032 Chronic diastolic (congestive) heart failure: Secondary | ICD-10-CM | POA: Diagnosis not present

## 2023-07-22 DIAGNOSIS — I517 Cardiomegaly: Secondary | ICD-10-CM | POA: Diagnosis not present

## 2023-07-22 DIAGNOSIS — I509 Heart failure, unspecified: Secondary | ICD-10-CM | POA: Diagnosis not present

## 2023-07-22 DIAGNOSIS — R7989 Other specified abnormal findings of blood chemistry: Secondary | ICD-10-CM | POA: Diagnosis not present

## 2023-07-22 DIAGNOSIS — E7800 Pure hypercholesterolemia, unspecified: Secondary | ICD-10-CM | POA: Diagnosis not present

## 2023-07-22 DIAGNOSIS — Z79899 Other long term (current) drug therapy: Secondary | ICD-10-CM | POA: Diagnosis not present

## 2023-07-22 DIAGNOSIS — Z87891 Personal history of nicotine dependence: Secondary | ICD-10-CM | POA: Diagnosis not present

## 2023-07-22 DIAGNOSIS — J449 Chronic obstructive pulmonary disease, unspecified: Secondary | ICD-10-CM | POA: Diagnosis not present

## 2023-07-22 DIAGNOSIS — Z792 Long term (current) use of antibiotics: Secondary | ICD-10-CM | POA: Diagnosis not present

## 2023-07-22 DIAGNOSIS — R319 Hematuria, unspecified: Secondary | ICD-10-CM | POA: Diagnosis not present

## 2023-07-22 DIAGNOSIS — Z7982 Long term (current) use of aspirin: Secondary | ICD-10-CM | POA: Diagnosis not present

## 2023-07-22 DIAGNOSIS — J9621 Acute and chronic respiratory failure with hypoxia: Secondary | ICD-10-CM | POA: Diagnosis not present

## 2023-07-22 DIAGNOSIS — F32A Depression, unspecified: Secondary | ICD-10-CM | POA: Diagnosis not present

## 2023-07-22 DIAGNOSIS — R55 Syncope and collapse: Secondary | ICD-10-CM | POA: Diagnosis not present

## 2023-07-22 DIAGNOSIS — J441 Chronic obstructive pulmonary disease with (acute) exacerbation: Secondary | ICD-10-CM | POA: Diagnosis not present

## 2023-07-22 DIAGNOSIS — R404 Transient alteration of awareness: Secondary | ICD-10-CM | POA: Diagnosis not present

## 2023-07-22 DIAGNOSIS — J90 Pleural effusion, not elsewhere classified: Secondary | ICD-10-CM | POA: Diagnosis not present

## 2023-07-22 DIAGNOSIS — I6782 Cerebral ischemia: Secondary | ICD-10-CM | POA: Diagnosis not present

## 2023-07-22 DIAGNOSIS — Z043 Encounter for examination and observation following other accident: Secondary | ICD-10-CM | POA: Diagnosis not present

## 2023-07-22 DIAGNOSIS — G238 Other specified degenerative diseases of basal ganglia: Secondary | ICD-10-CM | POA: Diagnosis not present

## 2023-07-22 DIAGNOSIS — R0902 Hypoxemia: Secondary | ICD-10-CM | POA: Diagnosis not present

## 2023-07-22 DIAGNOSIS — E119 Type 2 diabetes mellitus without complications: Secondary | ICD-10-CM | POA: Diagnosis not present

## 2023-07-22 DIAGNOSIS — E1140 Type 2 diabetes mellitus with diabetic neuropathy, unspecified: Secondary | ICD-10-CM | POA: Diagnosis not present

## 2023-07-22 DIAGNOSIS — Z743 Need for continuous supervision: Secondary | ICD-10-CM | POA: Diagnosis not present

## 2023-07-22 DIAGNOSIS — I2510 Atherosclerotic heart disease of native coronary artery without angina pectoris: Secondary | ICD-10-CM | POA: Diagnosis not present

## 2023-07-22 DIAGNOSIS — J949 Pleural condition, unspecified: Secondary | ICD-10-CM | POA: Diagnosis not present

## 2023-07-22 DIAGNOSIS — G4733 Obstructive sleep apnea (adult) (pediatric): Secondary | ICD-10-CM | POA: Diagnosis not present

## 2023-07-22 DIAGNOSIS — R6889 Other general symptoms and signs: Secondary | ICD-10-CM | POA: Diagnosis not present

## 2023-07-22 DIAGNOSIS — I110 Hypertensive heart disease with heart failure: Secondary | ICD-10-CM | POA: Diagnosis not present

## 2023-07-22 DIAGNOSIS — J9622 Acute and chronic respiratory failure with hypercapnia: Secondary | ICD-10-CM | POA: Diagnosis not present

## 2023-07-22 DIAGNOSIS — Z1152 Encounter for screening for COVID-19: Secondary | ICD-10-CM | POA: Diagnosis not present

## 2023-07-22 DIAGNOSIS — Z7951 Long term (current) use of inhaled steroids: Secondary | ICD-10-CM | POA: Diagnosis not present

## 2023-08-10 ENCOUNTER — Telehealth: Payer: Self-pay | Admitting: *Deleted

## 2023-08-10 ENCOUNTER — Encounter: Payer: Self-pay | Admitting: *Deleted

## 2023-08-10 NOTE — Patient Outreach (Signed)
Care Coordination   Initial Visit Note   08/10/2023 Name: Warren Lee MRN: [id number] DOB: July 13, 1951  Warren Lee is a 72 y.o. year old male who sees Kirstie Peri, MD for primary care. I spoke with  Anne Ng by phone today.   Care Coordination   Note   08/10/2023 Name: Warren Lee MRN: [id number] DOB: [birth year]  Warren Lee is a 72 y.o. year old male who sees Kirstie Peri, MD for primary care. I reached out to Anne Ng by phone today to offer care coordination services.  [name] was given information about Care Coordination services today including:   The Care Coordination services include support from the care team which includes your Nurse Coordinator, Clinical Social Worker, or Pharmacist.  The Care Coordination team is here to help remove barriers to the health concerns and goals most important to you. Care Coordination services are voluntary, and the patient may decline or stop services at any time by request to their care team member.   Care Coordination Consent Status: Patient agreed to services and verbal consent obtained.   What matters to the patients health and wellness today?  Managing blood sugar    Goals Addressed             This Visit's Progress    Manage Blood Sugar       Care Coordination Goals: Patient will follow-up with PCP and/or endocrinologist every 3 months or as recommended New patient visit scheduled with endocrinologit for 10/13/23 Patient will take medication as prescribed and reach out to provider with any negative side effects Patient will continue to monitor and record blood sugar 4 times per day and as needed with Dexcom G7, and will call PCP or endocrinologist with any readings outside of recommended range Patient will take blood sugar log and meter to provider visits for review Patient will eat 3 meals a day at regular intervals with about the same amount of carbohydrates 30 grams of carbohydrates per meal and up to  2 snacks per day with less than 15 grams of carbohydrates Patient will reach out to RN Care Coordinator [phone] with any care coordination or resource needs         SDOH assessments and interventions completed:  Yes  SDOH Interventions Today    Flowsheet Row Most Recent Value  SDOH Interventions   Food Insecurity Interventions Intervention Not Indicated  Housing Interventions Intervention Not Indicated  Transportation Interventions Patient Resources (Friends/Family)        Care Coordination Interventions:  Yes, provided  Interventions Today    Flowsheet Row Most Recent Value  Chronic Disease   Chronic disease during today's visit Diabetes, Congestive Heart Failure (CHF), Chronic Obstructive Pulmonary Disease (COPD), Hypertension (HTN)  General Interventions   General Interventions Discussed/Reviewed General Interventions Discussed, General Interventions Reviewed, Durable Medical Equipment (DME), Doctor Visits, Labs  Labs Hgb A1c every 3 months  Doctor Visits Discussed/Reviewed Doctor Visits Discussed, Doctor Visits Reviewed, PCP, Specialist  Durable Medical Equipment (DME) Oxygen, Glucomoter, BP Cuff, Other  [BIPAP, Dexcom G7, 3L O2. Blood sugar 107 this morning. Has had some readings below 70 within the past 2 weeks. Gets an alert from his monitor. Lowest was 58.]  PCP/Specialist Visits Compliance with follow-up visit  [Initial visit with endocrinologist is scheduled for 10/13/23]  Exercise Interventions   Exercise Discussed/Reviewed Physical Activity  Physical Activity Discussed/Reviewed Physical Activity Discussed, Physical Activity Reviewed  [Able to perform ADLs. Has assistance from wife if needed. Encouraged increased physical activity as tolerated  3-5 days per week.]  Education Interventions   Education Provided Provided Education, Provided Printed Education  [Printed handout on diabetes and nutrition and hypoglycemia]  Provided Verbal Education On Nutrition, Blood  Sugar Monitoring, When to see the doctor, Medication, Exercise, Labs  Labs Reviewed Hgb A1c  [06/29/23 A1C 11.2%]  Nutrition Interventions   Nutrition Discussed/Reviewed Nutrition Discussed, Nutrition Reviewed, Carbohydrate meal planning, Adding fruits and vegetables, Fluid intake, Portion sizes, Increasing proteins, Decreasing sugar intake  [Eat 3 meals at a day at regular intervals with about the same amount of carbohydrates. 30GM CHO recommended with meals and up to 2 snacks per day with less than 15GM of CHO.]  Pharmacy Interventions   Pharmacy Dicussed/Reviewed Pharmacy Topics Discussed, Pharmacy Topics Reviewed, Medications and their functions  [Wife puts medications in pill box. Takes Tresiba 50 units twice a day.]  Safety Interventions   Safety Discussed/Reviewed Safety Discussed, Safety Reviewed  [Risks associated with hypoglycemia]       Follow up plan: Follow up call scheduled for 09/01/23    Encounter Outcome:  Pt. Visit Completed   Demetrios Loll, BSN, RN-BC RN Care Coordinator [facility]  [facility] Direct Dial: [phone] Main #: [phone]

## 2023-08-12 DIAGNOSIS — R0602 Shortness of breath: Secondary | ICD-10-CM | POA: Diagnosis not present

## 2023-08-12 DIAGNOSIS — J449 Chronic obstructive pulmonary disease, unspecified: Secondary | ICD-10-CM | POA: Diagnosis not present

## 2023-08-12 DIAGNOSIS — R0689 Other abnormalities of breathing: Secondary | ICD-10-CM | POA: Diagnosis not present

## 2023-08-14 DIAGNOSIS — E119 Type 2 diabetes mellitus without complications: Secondary | ICD-10-CM | POA: Diagnosis not present

## 2023-08-14 DIAGNOSIS — Z87891 Personal history of nicotine dependence: Secondary | ICD-10-CM | POA: Diagnosis not present

## 2023-08-14 DIAGNOSIS — I10 Essential (primary) hypertension: Secondary | ICD-10-CM | POA: Diagnosis not present

## 2023-08-14 DIAGNOSIS — Z79899 Other long term (current) drug therapy: Secondary | ICD-10-CM | POA: Diagnosis not present

## 2023-08-14 DIAGNOSIS — N3289 Other specified disorders of bladder: Secondary | ICD-10-CM | POA: Diagnosis not present

## 2023-08-14 DIAGNOSIS — N2889 Other specified disorders of kidney and ureter: Secondary | ICD-10-CM | POA: Diagnosis not present

## 2023-08-14 DIAGNOSIS — R1030 Lower abdominal pain, unspecified: Secondary | ICD-10-CM | POA: Diagnosis not present

## 2023-08-14 DIAGNOSIS — K5730 Diverticulosis of large intestine without perforation or abscess without bleeding: Secondary | ICD-10-CM | POA: Diagnosis not present

## 2023-08-14 DIAGNOSIS — N3001 Acute cystitis with hematuria: Secondary | ICD-10-CM | POA: Diagnosis not present

## 2023-08-14 DIAGNOSIS — K8689 Other specified diseases of pancreas: Secondary | ICD-10-CM | POA: Diagnosis not present

## 2023-08-17 DIAGNOSIS — K130 Diseases of lips: Secondary | ICD-10-CM | POA: Diagnosis not present

## 2023-08-17 DIAGNOSIS — Z299 Encounter for prophylactic measures, unspecified: Secondary | ICD-10-CM | POA: Diagnosis not present

## 2023-08-17 DIAGNOSIS — N3289 Other specified disorders of bladder: Secondary | ICD-10-CM | POA: Diagnosis not present

## 2023-08-17 DIAGNOSIS — R319 Hematuria, unspecified: Secondary | ICD-10-CM | POA: Diagnosis not present

## 2023-08-17 DIAGNOSIS — I5032 Chronic diastolic (congestive) heart failure: Secondary | ICD-10-CM | POA: Diagnosis not present

## 2023-08-25 DIAGNOSIS — R3912 Poor urinary stream: Secondary | ICD-10-CM | POA: Diagnosis not present

## 2023-08-25 DIAGNOSIS — R310 Gross hematuria: Secondary | ICD-10-CM | POA: Diagnosis not present

## 2023-09-01 ENCOUNTER — Ambulatory Visit: Payer: Self-pay | Admitting: *Deleted

## 2023-09-01 ENCOUNTER — Encounter: Payer: Self-pay | Admitting: *Deleted

## 2023-09-01 NOTE — Patient Outreach (Signed)
Care Coordination   Follow Up Visit Note   09/01/2023 Name: Warren Lee MRN: [id number] DOB: 1951/04/08  Warren Lee is a 72 y.o. year old male who sees Kirstie Peri, MD for primary care. I spoke with  Anne Ng by phone today.  What matters to the patients health and wellness today?  Managing blood sugar    Goals Addressed             This Visit's Progress    Manage Blood Sugar   Not on track    Care Coordination Goals: Patient will keep appointment with endocrinologist for new patient visit on 10/13/23 Patient will monitor and record blood sugar 4 times per day and as needed with Dexcom G7, and will call PCP or endocrinologist with any readings outside of recommended range Patient will not ignore Dexcom alarms for high or low sugar alerts and will correct low readings with a snack with a protein and carbohydrate, like peanut butter crackers and will call provider with readings less than 70 or with 3 readings in a row greater than 200 Patient will take blood sugar log and meter to provider visits for review Patient will eat 3 meals a day at regular intervals with about the same amount of carbohydrates 30 grams of carbohydrates per meal and up to 2 snacks per day with less than 15 grams of carbohydrates Patient will reach out to RN Care Coordinator [phone] with any care coordination or resource needs         SDOH assessments and interventions completed:  Yes  SDOH Interventions Today    Flowsheet Row Most Recent Value  SDOH Interventions   Transportation Interventions Patient Resources (Friends/Family)  Financial Strain Interventions Intervention Not Indicated        Care Coordination Interventions:  Yes, provided  Interventions Today    Flowsheet Row Most Recent Value  Chronic Disease   Chronic disease during today's visit Diabetes, Other  [enlarged prostate]  General Interventions   General Interventions Discussed/Reviewed General Interventions  Discussed, General Interventions Reviewed, Labs, Annual Eye Exam, Annual Foot Exam, Durable Medical Equipment (DME), Doctor Visits, Vaccines, Communication with  Labs Hgb A1c every 3 months  Vaccines Flu, Pneumonia, RSV  Doctor Visits Discussed/Reviewed Doctor Visits Discussed, Doctor Visits Reviewed, PCP, Specialist, Annual Wellness Visits  [discussed recent visit with urologist, Dr Laverle Patter, and treatment for prostate enlargement. Pt. unsure if he is to follow-up with Dr Laverle Patter. ED visit on 08/14/23 for hematuria. CT scan showed bladder mass that may have been a hematoma vs a mass.]  Durable Medical Equipment (DME) Glucomoter, Other  [Dexcom. Pt. has not checked blood sugar this morning. He does check it throughout the day but puts it in a drawer at night so that he doesn't hear the "high" alarms. Advised against this. Denies readings less than 70. Freuquent readings over 200.]  PCP/Specialist Visits Compliance with follow-up visit  [New patient visit with Ronny Bacon, NP (endocrinology) on 10/13/23]  Communication with PCP/Specialists  [RN Call to Dr Laverle Patter (urologist) to see if patient needs to schedule a routine follow-up. Awaiting response. F/U seems reasonable since he was started on finesteride for prostate enlargement and recent ED visit for hematuria with possible bladder mass]  Exercise Interventions   Exercise Discussed/Reviewed Exercise Discussed, Exercise Reviewed, Physical Activity  Physical Activity Discussed/Reviewed Physical Activity Discussed, Physical Activity Reviewed  [Able to perform ADLs. Encouraged to increase physical activity as tolerated with a goal of 150 minutes per week]  Education Interventions   Education  Provided Provided Education  Provided Verbal Education On Nutrition, When to see the doctor, Blood Sugar Monitoring, Labs, Foot Care, Eye Care, Exercise, Medication  [Importance of monitoring blood sugar and notifying provider of readings less than 70 or greater than  200 for 3 readings in a row]  Labs Reviewed Hgb A1c  [06/29/23 A1C 11.2%]  Nutrition Interventions   Nutrition Discussed/Reviewed Nutrition Reviewed, Nutrition Discussed, Carbohydrate meal planning, Decreasing sugar intake, Portion sizes, Fluid intake, Adding fruits and vegetables, Increasing proteins, Supplemental nutrition  [Encouraged 3 meals per day with 30 GM of CHO each and up to 2 snacks per day, if needed, with less than 15 GM of CHO. Pt. drinks a protein shake daily.]  Pharmacy Interventions   Pharmacy Dicussed/Reviewed Pharmacy Topics Discussed, Pharmacy Topics Reviewed, Medications and their functions  [started on finesteride for enlarged prostate. Urinating more easily and tolerating well. Evaristo Bury was increased to 55 units BID. RN updated [facility] med list to reflect this.]  Safety Interventions   Safety Discussed/Reviewed Safety Discussed, Safety Reviewed, Fall Risk, Home Safety  [recent fall. Patient leaned over the tub and fell in. No injury]  Home Safety Assistive Devices  [has a walker but doesn't use it. Encouraged to do so, especially if feeling off balance or weak.]       Follow up plan: Follow up call scheduled for 10/15/23    Encounter Outcome:  Patient Visit Completed   Demetrios Loll, RN, BSN Care Management Coordinator [facility]  [facility] Direct Dial: [phone] Main #: [phone]

## 2023-09-20 DIAGNOSIS — I5032 Chronic diastolic (congestive) heart failure: Secondary | ICD-10-CM | POA: Diagnosis not present

## 2023-09-20 DIAGNOSIS — Z23 Encounter for immunization: Secondary | ICD-10-CM | POA: Diagnosis not present

## 2023-09-20 DIAGNOSIS — J441 Chronic obstructive pulmonary disease with (acute) exacerbation: Secondary | ICD-10-CM | POA: Diagnosis not present

## 2023-09-20 DIAGNOSIS — I10 Essential (primary) hypertension: Secondary | ICD-10-CM | POA: Diagnosis not present

## 2023-09-20 DIAGNOSIS — Z299 Encounter for prophylactic measures, unspecified: Secondary | ICD-10-CM | POA: Diagnosis not present

## 2023-09-20 DIAGNOSIS — E1165 Type 2 diabetes mellitus with hyperglycemia: Secondary | ICD-10-CM | POA: Diagnosis not present

## 2023-09-21 ENCOUNTER — Telehealth: Payer: Self-pay | Admitting: *Deleted

## 2023-09-21 NOTE — Progress Notes (Signed)
Care Coordination Note  09/21/2023 Name: Warren Lee MRN: [id number] DOB: [birth year]  Warren Lee is a 72 y.o. year old male who is a primary care patient of Kirstie Peri, MD and is actively engaged with the care management team. I reached out to Anne Ng by phone today to assist with re-scheduling a follow up visit with the RN Case Manager  Follow up plan: Telephone appointment with care management team member scheduled for:10/22/23 [facility] Coordination Care Guide  Direct Dial: [phone]

## 2023-09-21 NOTE — Progress Notes (Signed)
Care Coordination Note  09/21/2023 Name: Warren Lee MRN: [id number] DOB: 05-14-1951  Warren Lee is a 72 y.o. year old male who is a primary care patient of Kirstie Peri, MD and is actively engaged with the care management team. I reached out to Anne Ng by phone today to assist with re-scheduling a follow up visit with the RN Case Manager  Follow up plan: Unsuccessful telephone outreach attempt made. A HIPAA compliant phone message was left for the patient providing contact information and requesting a return call.   [place]  Care Coordination Care Guide  Direct Dial: [phone]

## 2023-10-13 ENCOUNTER — Ambulatory Visit: Payer: Medicare Other | Admitting: Nurse Practitioner

## 2023-10-15 ENCOUNTER — Encounter: Payer: Medicare Other | Admitting: *Deleted

## 2023-10-20 DIAGNOSIS — E1169 Type 2 diabetes mellitus with other specified complication: Secondary | ICD-10-CM | POA: Diagnosis not present

## 2023-10-20 DIAGNOSIS — I10 Essential (primary) hypertension: Secondary | ICD-10-CM | POA: Diagnosis not present

## 2023-10-20 DIAGNOSIS — I5032 Chronic diastolic (congestive) heart failure: Secondary | ICD-10-CM | POA: Diagnosis not present

## 2023-10-20 DIAGNOSIS — F17210 Nicotine dependence, cigarettes, uncomplicated: Secondary | ICD-10-CM | POA: Diagnosis not present

## 2023-10-20 DIAGNOSIS — Z299 Encounter for prophylactic measures, unspecified: Secondary | ICD-10-CM | POA: Diagnosis not present

## 2023-10-22 ENCOUNTER — Ambulatory Visit: Payer: Self-pay | Admitting: *Deleted

## 2023-10-22 NOTE — Patient Outreach (Signed)
Care Coordination   10/22/2023 Name: Warren Lee MRN: [id number] DOB: 1951/11/17   Care Coordination Outreach Attempts:  An unsuccessful telephone outreach was attempted for a scheduled appointment today.  Follow Up Plan:  Additional outreach attempts will be made to offer the patient care coordination information and services.   Encounter Outcome:  No Answer   Care Coordination Interventions:  No, not indicated    Demetrios Loll, RN, BSN Care Management Coordinator [facility]  [facility] Direct Dial: [phone] Main #: [phone]

## 2023-11-09 ENCOUNTER — Ambulatory Visit: Payer: Self-pay | Admitting: *Deleted

## 2023-11-09 ENCOUNTER — Encounter: Payer: Self-pay | Admitting: *Deleted

## 2023-11-09 NOTE — Patient Outreach (Signed)
Care Coordination   Follow Up Visit Note   11/09/2023 Name: Warren Lee MRN: [id number] DOB: Aug 18, 1951  Warren Lee is a 72 y.o. year old male who sees Kirstie Peri, MD for primary care. I spoke with  Anne Ng by phone today.  What matters to the patients health and wellness today?  Managing blood sugar    Goals Addressed             This Visit's Progress    Manage Blood Sugar   On track    Care Coordination Goals: Patient will keep appointment with endocrinologist for new patient visit on 12/20/23 Patient will monitor and record blood sugar 4 times per day and as needed with Dexcom G7, and will call PCP or endocrinologist with any readings outside of recommended range Patient will not ignore Dexcom alarms for high or low sugar alerts and will correct low readings with a snack with a protein and carbohydrate, like peanut butter crackers and will call provider with readings less than 70 or with 3 readings in a row greater than 200 Patient will take blood sugar log and meter to provider visits for review Patient will eat 3 meals a day at regular intervals with about the same amount of carbohydrates 30 grams of carbohydrates per meal and up to 2 snacks per day with less than 15 grams of carbohydrates Patient will reach out to RN Care Coordinator [phone] with any care coordination or resource needs         SDOH assessments and interventions completed:  Yes  SDOH Interventions Today    Flowsheet Row Most Recent Value  SDOH Interventions   Housing Interventions Intervention Not Indicated  Transportation Interventions Patient Resources (Friends/Family)  Utilities Interventions Intervention Not Indicated        Care Coordination Interventions:  Yes, provided  Interventions Today    Flowsheet Row Most Recent Value  Chronic Disease   Chronic disease during today's visit Diabetes  General Interventions   General Interventions Discussed/Reviewed General  Interventions Discussed, General Interventions Reviewed, Doctor Visits, Labs, Durable Medical Equipment (DME), Vaccines  Labs Hgb A1c every 3 months  Vaccines COVID-19, Flu, Pneumonia  Doctor Visits Discussed/Reviewed Doctor Visits Discussed, Doctor Visits Reviewed, PCP, Specialist, Annual Wellness Visits  Durable Medical Equipment (DME) Glucomoter, Other  [Dexcom G7. Glucose was 140 this morning. Up to 170 after eating yesterday but came back down within a few hours. Overall blood sugar is running lower than it was during our last call]  PCP/Specialist Visits Compliance with follow-up visit  [New patient endocrinology visit was rescheduled from October to 12/20/23]  Education Interventions   Education Provided Provided Education  Provided Verbal Education On Nutrition, Blood Sugar Monitoring, When to see the doctor, Medication  Nutrition Interventions   Nutrition Discussed/Reviewed Nutrition Discussed, Nutrition Reviewed, Carbohydrate meal planning, Adding fruits and vegetables, Increasing proteins, Fluid intake, Portion sizes, Decreasing sugar intake  [Eat 3 meals per day with 30 GM of CHO and up to 2 snacks per day, if needed, with less than 15 GM of CHO]  Pharmacy Interventions   Pharmacy Dicussed/Reviewed Pharmacy Topics Discussed, Medications and their functions, Pharmacy Topics Reviewed  [taking medications as prescribed. No questions or concerns at this time]  Safety Interventions   Safety Discussed/Reviewed Safety Discussed, Safety Reviewed, Fall Risk, Home Safety  Home Safety Assistive Devices       Follow up plan: Follow up call scheduled for 12/27/23    Encounter Outcome:  Patient Visit Completed   Demetrios Loll,  RN, BSN Care Management Coordinator [place]  [facility] Direct Dial: [phone] Main #: [phone]

## 2023-11-11 DIAGNOSIS — E1169 Type 2 diabetes mellitus with other specified complication: Secondary | ICD-10-CM | POA: Diagnosis not present

## 2023-11-11 DIAGNOSIS — M109 Gout, unspecified: Secondary | ICD-10-CM | POA: Diagnosis not present

## 2023-12-20 ENCOUNTER — Ambulatory Visit: Payer: Medicare Other | Admitting: Nurse Practitioner

## 2023-12-21 DIAGNOSIS — R069 Unspecified abnormalities of breathing: Secondary | ICD-10-CM | POA: Diagnosis not present

## 2023-12-27 ENCOUNTER — Ambulatory Visit: Payer: Self-pay | Admitting: *Deleted

## 2023-12-27 NOTE — Patient Outreach (Signed)
  Care Coordination   12/27/2023 Name: Warren Lee MRN: [id number] DOB: Apr 03, 1951   Care Coordination Outreach Attempts:  An unsuccessful outreach was attempted for an appointment today.  Follow Up Plan:  Additional outreach attempts will be made to offer the patient complex care management information and services.   Encounter Outcome:  No Answer. Left HIPAA compliant VM.   Care Coordination Interventions:  No, not indicated    Demetrios Loll, RN, BSN Care Manager [place]  [facility]  [facility]  Direct Dial: [phone] Main #: [phone]

## 2023-12-30 ENCOUNTER — Other Ambulatory Visit: Payer: Self-pay | Admitting: Internal Medicine

## 2024-01-03 DIAGNOSIS — E1169 Type 2 diabetes mellitus with other specified complication: Secondary | ICD-10-CM | POA: Diagnosis not present

## 2024-01-03 DIAGNOSIS — I10 Essential (primary) hypertension: Secondary | ICD-10-CM | POA: Diagnosis not present

## 2024-01-03 DIAGNOSIS — Z299 Encounter for prophylactic measures, unspecified: Secondary | ICD-10-CM | POA: Diagnosis not present

## 2024-01-03 DIAGNOSIS — J069 Acute upper respiratory infection, unspecified: Secondary | ICD-10-CM | POA: Diagnosis not present

## 2024-01-21 ENCOUNTER — Telehealth: Payer: Self-pay | Admitting: *Deleted

## 2024-01-21 NOTE — Progress Notes (Signed)
Complex Care Management Care Guide Note  01/21/2024 Name: Warren Lee MRN: [id number] DOB: 11-08-1951  [name] is a 73 y.o. year old male who is a primary care patient of Kirstie Peri, MD and is actively engaged with the care management team. I reached out to Anne Ng by phone today to assist with re-scheduling  with the RN Case Manager.  Follow up plan: Unsuccessful telephone outreach attempt made.   Gwenevere Ghazi  [facility]  Value-Based Care Institute, [facility] Guide  Direct Dial: [phone]  Fax [phone]

## 2024-01-21 NOTE — Progress Notes (Signed)
Complex Care Management Care Guide Note  01/21/2024 Name: Warren Lee MRN: [id number] DOB: 12/22/1951  Warren Lee is a 73 y.o. year old male who is a primary care patient of Kirstie Peri, MD and is actively engaged with the care management team. I reached out to Anne Ng by phone today to assist with scheduling  with the RN Case Manager.  Follow up plan: Telephone appointment with complex care management team member scheduled for:  1/27  Gwenevere Ghazi  [facility] Health  Value-Based Care Institute, [facility] Guide  Direct Dial: [phone]  Fax [phone]

## 2024-01-24 ENCOUNTER — Encounter: Payer: Self-pay | Admitting: *Deleted

## 2024-01-25 ENCOUNTER — Other Ambulatory Visit: Payer: Self-pay | Admitting: Internal Medicine

## 2024-01-27 ENCOUNTER — Encounter: Payer: Self-pay | Admitting: *Deleted

## 2024-02-09 ENCOUNTER — Encounter: Payer: Self-pay | Admitting: *Deleted

## 2024-02-09 ENCOUNTER — Ambulatory Visit: Payer: Self-pay | Admitting: *Deleted

## 2024-02-09 NOTE — Patient Outreach (Signed)
  Care Coordination   Follow Up Visit Note   02/09/2024 Name: Warren Lee MRN: [id number] DOB: 11-09-1951  Warren Lee is a 73 y.o. year old male who sees Kirstie Peri, MD for primary care. I spoke with  Anne Ng by phone today.  What matters to the patients health and wellness today?  Managing blood sugar    Goals Addressed             This Visit's Progress    Manage Blood Sugar       Care Coordination Goals: Patient will keep appointment with endocrinologist for new patient visit on 03/22/24 Patient will monitor and record blood sugar 4 times per day and as needed with Dexcom G7, and will call PCP or endocrinologist with any readings outside of recommended range Patient will take blood sugar log and meter to provider visits for review Patient will eat 3 meals a day at regular intervals with about the same amount of carbohydrates 30 grams of carbohydrates per meal and up to 2 snacks per day with less than 15 grams of carbohydrates Patient will reach out to RN Care Coordinator [phone] with any care coordination or resource needs         SDOH assessments and interventions completed:  Yes  SDOH Interventions Today    Flowsheet Row Most Recent Value  SDOH Interventions   Food Insecurity Interventions Intervention Not Indicated  Transportation Interventions Patient Resources (Friends/Family)        Care Coordination Interventions:  Yes, provided  Interventions Today    Flowsheet Row Most Recent Value  Chronic Disease   Chronic disease during today's visit Diabetes  General Interventions   General Interventions Discussed/Reviewed General Interventions Discussed, General Interventions Reviewed, Annual Eye Exam, Labs, Annual Foot Exam, Durable Medical Equipment (DME), Doctor Visits  Labs Hgb A1c every 3 months  Doctor Visits Discussed/Reviewed Doctor Visits Discussed, Doctor Visits Reviewed, Annual Wellness Visits, PCP, Specialist  Durable Medical Equipment  (DME) Other  [Dexcom G7]  PCP/Specialist Visits Compliance with follow-up visit  [Patient has rescheduled initial endocrinology visit for 03/22/24]  Exercise Interventions   Exercise Discussed/Reviewed Physical Activity  Physical Activity Discussed/Reviewed Physical Activity Discussed, Physical Activity Reviewed  [encouraged to increase physical activity level as tolerated with a goal of 150 minutes per week]  Education Interventions   Education Provided Provided Education  Provided Verbal Education On Nutrition, Foot Care, Eye Care, Labs, Blood Sugar Monitoring, Exercise, Medication, When to see the doctor  Labs Reviewed Hgb A1c  Nutrition Interventions   Nutrition Discussed/Reviewed Nutrition Discussed, Nutrition Reviewed, Carbohydrate meal planning, Adding fruits and vegetables, Decreasing sugar intake, Portion sizes, Fluid intake, Increasing proteins  [eat 3 meals per day with 30 GM of CHO and up to 2 snacks, if needed, with less than 15 GM of CHO]  Pharmacy Interventions   Pharmacy Dicussed/Reviewed Pharmacy Topics Discussed, Pharmacy Topics Reviewed, Medications and their functions  Safety Interventions   Safety Discussed/Reviewed Safety Discussed, Safety Reviewed, Fall Risk, Home Safety  Home Safety Assistive Devices       Follow up plan: Follow up call scheduled for 03/08/24    Encounter Outcome:  Patient Visit Completed   Demetrios Loll, RN, BSN [place]  [facility], [facility] Health RN Care Manager Direct Dial: [phone]

## 2024-02-10 ENCOUNTER — Other Ambulatory Visit: Payer: Self-pay | Admitting: Internal Medicine

## 2024-03-08 ENCOUNTER — Ambulatory Visit: Payer: Self-pay | Admitting: *Deleted

## 2024-03-08 NOTE — Patient Outreach (Signed)
 Care Coordination   03/08/2024 Name: Warren Lee MRN: [id number] DOB: 1951-12-05   Care Coordination Outreach Attempts:  An unsuccessful outreach was attempted for an appointment today.  Follow Up Plan:  Additional outreach attempts will be made to offer the patient complex care management information and services.   Encounter Outcome:  No Answer. Left HIPAA compliant VM.   Care Coordination Interventions:  No, not indicated. Staff message sent to care guide requesting outreach and rescheduling.    Demetrios Loll, RN, BSN [place]  [facility], [facility] Health RN Care Manager Direct Dial: [phone]

## 2024-03-09 ENCOUNTER — Telehealth: Payer: Self-pay | Admitting: Internal Medicine

## 2024-03-09 MED ORDER — FUROSEMIDE 40 MG PO TABS: 40.0000 mg | ORAL_TABLET | Freq: Every day | ORAL | 1 refills | Status: DC

## 2024-03-09 NOTE — Telephone Encounter (Signed)
 1. Which medications need to be refilled? (please list name of each medication and dose if known) Furosemide 40mg    2. Which pharmacy/location (including street and city if local pharmacy) is medication to be sent to? [facility]   3. Do they need a 30 day or 90 day supply? 30 supply   Steward Drone came in to office and stated "I notice a difference when he doesn't have this medicine." Pharmacy Gave them enough to get to 03/15/24. I offered them an appointment with Philis Nettle, but they could not make it and they want to see Mallipeddi. I have scheduled them for the soonest appt we have.   Please call [phone] and ask for Steward Drone!

## 2024-03-09 NOTE — Telephone Encounter (Signed)
 DoneSteward Drone notified # [phone] with 1 refill sent to pharmacy.

## 2024-03-20 ENCOUNTER — Telehealth: Payer: Self-pay | Admitting: *Deleted

## 2024-03-20 NOTE — Progress Notes (Signed)
 Complex Care Management Care Guide Note  03/20/2024 Name: Warren Lee MRN: [id number] DOB: July 24, 1951  Warren Lee is a 73 y.o. year old male who is a primary care patient of Kirstie Peri, MD and is actively engaged with the care management team. I reached out to Anne Ng by phone today to assist with re-scheduling  with the RN Case Manager.  Follow up plan: Unsuccessful telephone outreach attempt made. A HIPAA compliant phone message was left for the patient providing contact information and requesting a return call.  Gwenevere Ghazi  [facility] Health  Value-Based Care Institute, [facility] Guide  Direct Dial: [phone]  Fax [phone]

## 2024-03-22 ENCOUNTER — Ambulatory Visit: Payer: Medicare Other | Admitting: Nurse Practitioner

## 2024-03-22 NOTE — Progress Notes (Signed)
 Complex Care Management Care Guide Note  03/22/2024 Name: Warren Lee MRN: [id number] DOB: Feb 23, 1951  Warren Lee is a 73 y.o. year old male who is a primary care patient of Kirstie Peri, MD and is actively engaged with the care management team. I reached out to Anne Ng by phone today to assist with re-scheduling  with the RN Case Manager.  Follow up plan: Unsuccessful telephone outreach attempt made. A HIPAA compliant phone message was left for the patient providing contact information and requesting a return call.  Gwenevere Ghazi  [facility] Health  Value-Based Care Institute, [facility] Guide  Direct Dial: [phone]  Fax [phone]

## 2024-03-23 ENCOUNTER — Other Ambulatory Visit: Payer: Self-pay | Admitting: Internal Medicine

## 2024-03-23 DIAGNOSIS — Z79899 Other long term (current) drug therapy: Secondary | ICD-10-CM

## 2024-03-23 DIAGNOSIS — E1169 Type 2 diabetes mellitus with other specified complication: Secondary | ICD-10-CM | POA: Diagnosis not present

## 2024-03-23 DIAGNOSIS — Z299 Encounter for prophylactic measures, unspecified: Secondary | ICD-10-CM | POA: Diagnosis not present

## 2024-03-23 DIAGNOSIS — J449 Chronic obstructive pulmonary disease, unspecified: Secondary | ICD-10-CM | POA: Diagnosis not present

## 2024-03-23 DIAGNOSIS — I5032 Chronic diastolic (congestive) heart failure: Secondary | ICD-10-CM | POA: Diagnosis not present

## 2024-03-23 DIAGNOSIS — I25798 Atherosclerosis of other coronary artery bypass graft(s) with other forms of angina pectoris: Secondary | ICD-10-CM

## 2024-03-23 DIAGNOSIS — I5020 Unspecified systolic (congestive) heart failure: Secondary | ICD-10-CM

## 2024-03-23 DIAGNOSIS — F17210 Nicotine dependence, cigarettes, uncomplicated: Secondary | ICD-10-CM | POA: Diagnosis not present

## 2024-03-23 DIAGNOSIS — I10 Essential (primary) hypertension: Secondary | ICD-10-CM | POA: Diagnosis not present

## 2024-03-31 NOTE — Progress Notes (Signed)
 Complex Care Management Care Guide Note  03/31/2024 Name: Warren Lee MRN: [id number] DOB: [birth year]  [name] is a 73 y.o. year old male who is a primary care patient of Kirstie Peri, MD and is actively engaged with the care management team. I reached out to Anne Ng by phone today to assist with re-scheduling  with the RN Case Manager.  Follow up plan: Unsuccessful telephone outreach attempt made. A HIPAA compliant phone message was left for the patient providing contact information and requesting a return call.No further outreach attempts will be made at this time. We have been unable to contact the patient to reschedule for complex care management services.   Gwenevere Ghazi  [facility] Health  Value-Based Care Institute, [facility] Guide  Direct Dial: [phone]  Fax [phone]

## 2024-04-05 ENCOUNTER — Other Ambulatory Visit: Payer: Self-pay | Admitting: Internal Medicine

## 2024-04-18 ENCOUNTER — Encounter: Payer: Self-pay | Admitting: Internal Medicine

## 2024-04-18 ENCOUNTER — Ambulatory Visit: Attending: Internal Medicine | Admitting: Internal Medicine

## 2024-04-18 VITALS — BP 118/52 | HR 65 | Ht 71.0 in | Wt 197.8 lb

## 2024-04-18 DIAGNOSIS — I10 Essential (primary) hypertension: Secondary | ICD-10-CM | POA: Diagnosis not present

## 2024-04-18 DIAGNOSIS — I25798 Atherosclerosis of other coronary artery bypass graft(s) with other forms of angina pectoris: Secondary | ICD-10-CM

## 2024-04-18 DIAGNOSIS — Z720 Tobacco use: Secondary | ICD-10-CM | POA: Diagnosis not present

## 2024-04-18 NOTE — Patient Instructions (Addendum)

## 2024-04-18 NOTE — Progress Notes (Signed)
 [facility]    Cardiology Office Note  Date: 04/18/2024   ID: [name], DOB [birth year], MRN [id number]  PCP:  Theoplis Fix, MD  Cardiologist:  Lasalle Pointer, MD Electrophysiologist:  None   Reason for Office Visit: Follow-up of CAD  History of Present Illness: Warren Lee is a 73 y.o. male known to have CAD s/p PTCA of diagonal in 1998 (LHC in 2003 showed no new obstructive disease), abnormal nuclear stress test in 2020 (small defect of mild severity in the apex, low risk study with no intervention), HTN, DM 2, HLD, severe COPD on 3 L of home oxygen . Patient was last seen by me in 09/2022. Accompanied by wife.  Patient was admitted to [facility] in 2023 with acute hypoxic hypercapnic respiratory failure secondary to COPD exacerbation, stabilized and discharged home.  He is here today for follow-up visit.  He had interval hospitalization for carbon monoxide poisoning due to chronic smoking.  Currently smokes 1 pack/day.  No angina.  He does have some chest pains in the left side of his chest when he moves his left arm.  He said he has a bad left shoulder and gets shots to relieve the pain.  Has stable chronic DOE.  He has severe COPD currently on home oxygen .  He has positional dizziness.  No exertional dizziness.  He did pass out 1 time but wife said it is from carbon monoxide poisoning.  No leg swelling.  Past Medical History:  Diagnosis Date   Abdominal wall fistula    Anxiety    Chronic back pain    Chronic pancreatitis [name])    CT July 2012: Chronic calcific pancreatitis without evidence of acute   Coronary artery disease    Diabetes mellitus    Heart disease    Hypercholesterolemia    Hypertension    Pancreatitis    S/P colonoscopy 2007   Dr. Florette Hurry: normal    S/P endoscopy July 2012   normal   Shortness of breath    Systolic murmur     Past Surgical History:  Procedure Laterality Date   CARDIAC CATHETERIZATION  2003   CHEST TUBE INSERTION      CHOLECYSTECTOMY     with complications, prolonged hospitalization, with bile leak   ESOPHAGOGASTRODUODENOSCOPY  07/22/2011   Procedure: ESOPHAGOGASTRODUODENOSCOPY (EGD);  Surgeon: Suzette Espy, MD;  Location: [facility];  Service: Endoscopy;  Laterality: N/A;   exploratory laparotomy X 2     after complications with chole   PTCA  1998   TRACHEOSTOMY      Current Outpatient Medications  Medication Sig Dispense Refill   albuterol  (PROVENTIL ) (2.5 MG/3ML) 0.083% nebulizer solution SMARTSIG:1 Vial(s) Via Nebulizer 4 Times Daily PRN     albuterol  (VENTOLIN  HFA) 108 (90 Base) MCG/ACT inhaler Inhale 2 puffs into the lungs every 4 (four) hours as needed.     alfuzosin (UROXATRAL) 10 MG 24 hr tablet Take 10 mg by mouth daily.     aspirin  EC 81 MG tablet Take 1 tablet (81 mg total) by mouth daily. 90 tablet 3   bisoprolol  (ZEBETA ) 5 MG tablet TAKE 1 TABLET (5 MG TOTAL) BY MOUTH DAILY. 90 tablet 0   finasteride (PROSCAR) 5 MG tablet Take 5 mg by mouth daily.     Fluticasone-Umeclidin-Vilant (TRELEGY ELLIPTA IN) Inhale 1 puff into the lungs daily.     furosemide  (LASIX ) 40 MG tablet TAKE 1 TABLET BY MOUTH EVERY DAY 90 tablet 0   gabapentin (NEURONTIN) 300 MG capsule  Take 300 mg by mouth 2 (two) times daily.     glimepiride (AMARYL) 4 MG tablet Take 4 mg by mouth daily.     Magnesium Oxide 420 MG TABS Take 1 tablet by mouth daily.     metFORMIN (GLUCOPHAGE) 850 MG tablet Take 850 mg by mouth 2 (two) times daily with a meal.     Multiple Vitamin (MULTIVITAMIN WITH MINERALS) TABS tablet Take 1 tablet by mouth daily.     nitroGLYCERIN  (NITROSTAT ) 0.4 MG SL tablet Place 1 tablet (0.4 mg total) under the tongue every 5 (five) minutes x 3 doses as needed for chest pain (if no relief after 3rd dose, proceed to ED or call 911). 25 tablet 3   PARoxetine (PAXIL) 40 MG tablet Take 40 mg by mouth daily.     rosuvastatin (CRESTOR) 10 MG tablet Take 10 mg by mouth at bedtime.     TRESIBA FLEXTOUCH 100 UNIT/ML  FlexTouch Pen Inject into the skin. 60 units every morning & 20 units every evening     No current facility-administered medications for this visit.   Allergies:  Patient has no known allergies.   Social History: The patient  reports that he has quit smoking. His smoking use included cigarettes. He has a 60 pack-year smoking history. He has never used smokeless tobacco. He reports that he does not drink alcohol  and does not use drugs.   Family History: The patient's family history includes Heart failure in his father.   ROS:  Please see the history of present illness. Otherwise, complete review of systems is positive for none.  All other systems are reviewed and negative.   Physical Exam: VS:  BP (!) 118/52   Pulse 65   Ht 5\' 11"  (1.803 m)   Wt 197 lb 12.8 oz (89.7 kg)   SpO2 94%   BMI 27.59 kg/m , BMI Body mass index is 27.59 kg/m.  Wt Readings from Last 3 Encounters:  04/18/24 197 lb 12.8 oz (89.7 kg)  12/11/22 202 lb (91.6 kg)  10/28/22 198 lb 3.2 oz (89.9 kg)    General: Patient appears comfortable at rest. HEENT: Conjunctiva and lids normal, oropharynx clear with moist mucosa. Neck: Supple, not able to lay flat  Lungs: Clear to auscultation, nonlabored breathing at rest. Cardiac: Regular rate and rhythm, no S3 or significant systolic murmur, no pericardial rub. Abdomen: Soft, nontender, no hepatomegaly, bowel sounds present, no guarding or rebound. Extremities: No pitting edema, distal pulses 2+. Skin: Warm and dry. Musculoskeletal: No kyphosis. Neuropsychiatric: Alert and oriented x3, affect grossly appropriate.  ECG:  An ECG dated 10/28/2022 was personally reviewed today and demonstrated:  NSR  Recent Labwork: No results found for requested labs within last 365 days.     Component Value Date/Time   CHOL 111 09/29/2013 0804   TRIG 95 09/29/2013 0804   HDL 32 (L) 09/29/2013 0804   CHOLHDL 3.5 09/29/2013 0804   VLDL 19 09/29/2013 0804   LDLCALC 60 09/29/2013 0804     Other Studies Reviewed Today: Echo in April 2021 Summary    1. Technically difficult study due to patient position and body habitus.    2. Echo contrast utilized to enhance endocardial border definition.    3. The left ventricle is normal in size with mildly increased wall  thickness.    4. The left ventricular systolic function is normal, LVEF is visually  estimated at 65-70%.    5. There is grade I diastolic dysfunction (impaired relaxation).  6. The left atrium is moderately dilated in size.    7. The right ventricle is normal in size, with normal systolic function.   Assessment and Plan:  CAD s/p PTCA of diagonal in 1998: No interval angina.  NST in 2020 showed small defect of mild severity in the apex, low risk study.  EKG today showed NSR, deep T wave inversions in the anterolateral leads.  This is new.  He absolutely denies having any angina.  No worsening of DOE.  He does have stable chronic DOE from COPD wearing home oxygen .  ER precautions for chest pain provided.  Continue aspirin  81 mg once daily, rosuvastatin 10 mg nightly.  Chronic diastolic heart failure: Continue p.o. Lasix  40 mg once daily.  HLD, unknown values: Continue rosuvastatin 10 mg at bedtime.  Goal LDL less than 70.  He will get labs with his PCP soon.  Will need to add a lipid panel at that time.  Severe COPD on 3L home oxygen : Per primary.  HTN, controlled: Continue current antihypertensives, continue bisoprolol  5 mg once daily.  Not on valsartan  and spironolactone anymore.  Nicotine abuse: Currently smokes 1 pack/day.  Counseling provided.     Medication Adjustments/Labs and Tests Ordered: Current medicines are reviewed at length with the patient today.  Concerns regarding medicines are outlined above.   Tests Ordered: Orders Placed This Encounter  Procedures   EKG 12-Lead    Medication Changes: No orders of the defined types were placed in this encounter.   Disposition:  Follow up 1  year  Signed Dontavion Noxon Beauford Bounds, MD, 04/18/2024 10:52 AM    [facility] [address] [place], [place], [place] [address]

## 2024-05-29 ENCOUNTER — Other Ambulatory Visit: Payer: Self-pay | Admitting: Internal Medicine

## 2024-06-08 DIAGNOSIS — I5032 Chronic diastolic (congestive) heart failure: Secondary | ICD-10-CM | POA: Diagnosis not present

## 2024-06-08 DIAGNOSIS — J9611 Chronic respiratory failure with hypoxia: Secondary | ICD-10-CM | POA: Diagnosis not present

## 2024-06-08 DIAGNOSIS — Z299 Encounter for prophylactic measures, unspecified: Secondary | ICD-10-CM | POA: Diagnosis not present

## 2024-06-08 DIAGNOSIS — E1169 Type 2 diabetes mellitus with other specified complication: Secondary | ICD-10-CM | POA: Diagnosis not present

## 2024-06-08 DIAGNOSIS — F17210 Nicotine dependence, cigarettes, uncomplicated: Secondary | ICD-10-CM | POA: Diagnosis not present

## 2024-06-14 DIAGNOSIS — E1169 Type 2 diabetes mellitus with other specified complication: Secondary | ICD-10-CM | POA: Diagnosis not present

## 2024-06-14 LAB — TSH: TSH: 4.01 (ref 0.41–5.90)

## 2024-06-14 LAB — BASIC METABOLIC PANEL WITH GFR
BUN: 20 (ref 4–21)
Creatinine: 1.1 (ref 0.6–1.3)
Glucose: 152

## 2024-06-14 LAB — LIPID PANEL
LDL Cholesterol: 47
Triglycerides: 148 (ref 40–160)

## 2024-06-14 LAB — COMPREHENSIVE METABOLIC PANEL WITH GFR: eGFR: 74

## 2024-06-23 ENCOUNTER — Other Ambulatory Visit: Payer: Self-pay | Admitting: Internal Medicine

## 2024-06-23 DIAGNOSIS — I5020 Unspecified systolic (congestive) heart failure: Secondary | ICD-10-CM

## 2024-06-23 DIAGNOSIS — Z79899 Other long term (current) drug therapy: Secondary | ICD-10-CM

## 2024-06-23 DIAGNOSIS — I25798 Atherosclerosis of other coronary artery bypass graft(s) with other forms of angina pectoris: Secondary | ICD-10-CM

## 2024-06-26 NOTE — Patient Instructions (Signed)

## 2024-06-29 ENCOUNTER — Other Ambulatory Visit: Payer: Self-pay | Admitting: Nurse Practitioner

## 2024-06-29 ENCOUNTER — Ambulatory Visit: Admitting: Nurse Practitioner

## 2024-06-29 ENCOUNTER — Encounter: Payer: Self-pay | Admitting: Nurse Practitioner

## 2024-06-29 VITALS — BP 130/62 | HR 94 | Ht 71.0 in | Wt 196.2 lb

## 2024-06-29 DIAGNOSIS — N182 Chronic kidney disease, stage 2 (mild): Secondary | ICD-10-CM

## 2024-06-29 DIAGNOSIS — Z794 Long term (current) use of insulin: Secondary | ICD-10-CM

## 2024-06-29 DIAGNOSIS — I10 Essential (primary) hypertension: Secondary | ICD-10-CM

## 2024-06-29 DIAGNOSIS — E782 Mixed hyperlipidemia: Secondary | ICD-10-CM | POA: Diagnosis not present

## 2024-06-29 DIAGNOSIS — E1122 Type 2 diabetes mellitus with diabetic chronic kidney disease: Secondary | ICD-10-CM | POA: Diagnosis not present

## 2024-06-29 DIAGNOSIS — Z7984 Long term (current) use of oral hypoglycemic drugs: Secondary | ICD-10-CM

## 2024-06-29 LAB — POCT GLYCOSYLATED HEMOGLOBIN (HGB A1C): Hemoglobin A1C: 8.5 % — AB (ref 4.0–5.6)

## 2024-06-29 MED ORDER — METFORMIN HCL 850 MG PO TABS: 850.0000 mg | ORAL_TABLET | Freq: Two times a day (BID) | ORAL | 3 refills | Status: DC

## 2024-06-29 MED ORDER — TRESIBA FLEXTOUCH 100 UNIT/ML ~~LOC~~ SOPN: 40.0000 [IU] | PEN_INJECTOR | Freq: Every day | SUBCUTANEOUS | 3 refills | Status: DC

## 2024-06-29 NOTE — Progress Notes (Signed)
 Endocrinology Consult Note       06/29/2024, 4:22 PM   Subjective:    Patient ID: Warren Lee, male    DOB: [birth year].  Warren Lee is being seen in consultation for management of currently uncontrolled symptomatic diabetes requested by  Maree Isles, MD.   Past Medical History:  Diagnosis Date   Abdominal wall fistula    Anxiety    Chronic back pain    Chronic pancreatitis [facility])    CT July 2012: Chronic calcific pancreatitis without evidence of acute   Coronary artery disease    Diabetes mellitus    Heart disease    Hypercholesterolemia    Hypertension    Pancreatitis    S/P colonoscopy 2007   Dr. Barry: normal    S/P endoscopy July 2012   normal   Shortness of breath    Systolic murmur     Past Surgical History:  Procedure Laterality Date   CARDIAC CATHETERIZATION  2003   CHEST TUBE INSERTION     CHOLECYSTECTOMY     with complications, prolonged hospitalization, with bile leak   ESOPHAGOGASTRODUODENOSCOPY  07/22/2011   Procedure: ESOPHAGOGASTRODUODENOSCOPY (EGD);  Surgeon: Lamar CHRISTELLA Hollingshead, MD;  Location: [facility];  Service: Endoscopy;  Laterality: N/A;   exploratory laparotomy X 2     after complications with chole   PTCA  1998   TRACHEOSTOMY      Social History   Socioeconomic History   Marital status: Married    Spouse name: Not on file   Number of children: Not on file   Years of education: Not on file   Highest education level: Not on file  Occupational History   Not on file  Tobacco Use   Smoking status: Former    Current packs/day: 1.50    Average packs/day: 1.5 packs/day for 40.0 years (60.0 ttl pk-yrs)    Types: Cigarettes   Smokeless tobacco: Never   Tobacco comments:    Smokes 3/4 - pack per day   Vaping Use   Vaping status: Never Used  Substance and Sexual Activity   Alcohol  use: No   Drug use: No   Sexual activity: Not on file  Other Topics Concern    Not on file  Social History Narrative   Not on file   Social Drivers of Health   Financial Resource Strain: Low Risk  (09/01/2023)   Overall Financial Resource Strain (CARDIA)    Difficulty of Paying Living Expenses: Not very hard  Food Insecurity: No Food Insecurity (02/09/2024)   Hunger Vital Sign    Worried About Running Out of Food in the Last Year: Never true    Ran Out of Food in the Last Year: Never true  Transportation Needs: No Transportation Needs (02/09/2024)   PRAPARE - Administrator, Civil Service (Medical): No    Lack of Transportation (Non-Medical): No  Physical Activity: Insufficiently Active (07/22/2023)   Received from [facility]   Exercise Vital Sign    On average, how many days per week do you engage in moderate to strenuous exercise (like a brisk walk)?: 7 days    On average, how many minutes do you engage  in exercise at this level?: 20 min  Stress: Not on file  Social Connections: Not on file    Family History  Problem Relation Age of Onset   Heart failure Father    Colon cancer Neg Hx     Outpatient Encounter Medications as of 06/29/2024  Medication Sig   albuterol  (PROVENTIL ) (2.5 MG/3ML) 0.083% nebulizer solution SMARTSIG:1 Vial(s) Via Nebulizer 4 Times Daily PRN   albuterol  (VENTOLIN  HFA) 108 (90 Base) MCG/ACT inhaler Inhale 2 puffs into the lungs every 4 (four) hours as needed.   alfuzosin (UROXATRAL) 10 MG 24 hr tablet Take 10 mg by mouth daily.   aspirin  EC 81 MG tablet Take 1 tablet (81 mg total) by mouth daily.   bisoprolol  (ZEBETA ) 5 MG tablet TAKE 1 TABLET (5 MG TOTAL) BY MOUTH DAILY.   finasteride (PROSCAR) 5 MG tablet Take 5 mg by mouth daily.   Fluticasone-Umeclidin-Vilant (TRELEGY ELLIPTA IN) Inhale 1 puff into the lungs daily.   furosemide  (LASIX ) 40 MG tablet TAKE 1 TABLET BY MOUTH EVERY DAY   gabapentin (NEURONTIN) 300 MG capsule Take 300 mg by mouth 2 (two) times daily.   Magnesium Oxide 420 MG TABS Take 1 tablet by mouth  daily.   Multiple Vitamin (MULTIVITAMIN WITH MINERALS) TABS tablet Take 1 tablet by mouth daily.   nitroGLYCERIN  (NITROSTAT ) 0.4 MG SL tablet Place 1 tablet (0.4 mg total) under the tongue every 5 (five) minutes x 3 doses as needed for chest pain (if no relief after 3rd dose, proceed to ED or call 911).   PARoxetine (PAXIL) 40 MG tablet Take 40 mg by mouth daily.   rosuvastatin (CRESTOR) 10 MG tablet Take 10 mg by mouth at bedtime.   [DISCONTINUED] glimepiride (AMARYL) 4 MG tablet Take 4 mg by mouth daily.   [DISCONTINUED] metFORMIN (GLUCOPHAGE) 850 MG tablet Take 850 mg by mouth 2 (two) times daily with a meal.   [DISCONTINUED] TRESIBA FLEXTOUCH 100 UNIT/ML FlexTouch Pen Inject into the skin. 60 units every morning & 20 units every evening (Patient taking differently: Inject into the skin 2 (two) times daily. 60 units every morning & 25 units every evening)   metFORMIN (GLUCOPHAGE) 850 MG tablet Take 1 tablet (850 mg total) by mouth 2 (two) times daily with a meal.   TRESIBA FLEXTOUCH 100 UNIT/ML FlexTouch Pen Inject 40 Units into the skin at bedtime. 60 units every morning & 20 units every evening   No facility-administered encounter medications on file as of 06/29/2024.    ALLERGIES: No Known Allergies  VACCINATION STATUS:  There is no immunization history on file for this patient.  Diabetes He presents for his initial diabetic visit. He has type 2 diabetes mellitus. Onset time: diagnosed at appros age of 18. His disease course has been fluctuating. Hypoglycemia symptoms include nervousness/anxiousness, sweats and tremors. Associated symptoms include blurred vision, fatigue, foot paresthesias, polydipsia and polyuria. There are no hypoglycemic complications. Symptoms are stable. Diabetic complications include heart disease, nephropathy, peripheral neuropathy and retinopathy. Risk factors for coronary artery disease include diabetes mellitus, male sex, hypertension, sedentary lifestyle and  tobacco exposure. Current diabetic treatment includes insulin  injections and oral agent (dual therapy). He is compliant with treatment most of the time. His weight is stable. He is following a generally unhealthy diet. When asked about meal planning, he reported none. He has not had a previous visit with a dietitian. He participates in exercise intermittently. His overall blood glucose range is 180-200 mg/dl. (He presents today, accompanied by his wife,  for his consultation with his CGM showing slightly above target glycemic profile.  His POCT A1c today is 8.5%.  He drinks black coffee, SF sprite, water, and sometime sweet tea.  He eats 3 meals per day and snacks between.  He does not engage in routine physical activity due to breathing problems.  He is UTD on eye exam, has never seen podiatry in the past.  Analysis of his CGM shows TIR 50%, TAR 0%, TBR 0% with a GMi of 7.7%.) An ACE inhibitor/angiotensin II receptor blocker is not being taken. He does not see a podiatrist.Eye exam is current.     Review of systems  Constitutional: + Minimally fluctuating body weight, current Body mass index is 27.36 kg/m., no fatigue, no subjective hyperthermia, no subjective hypothermia Eyes: no blurry vision, no xerophthalmia ENT: no sore throat, no nodules palpated in throat, no dysphagia/odynophagia, no hoarseness Cardiovascular: no chest pain, no shortness of breath, no palpitations, no leg swelling Respiratory: no cough, + shortness of breath- on chronic O2 Gastrointestinal: no nausea/vomiting/diarrhea Musculoskeletal: no muscle/joint aches Skin: no rashes, no hyperemia Neurological: no tremors, no numbness, no tingling, no dizziness Psychiatric: no depression, no anxiety  Objective:     BP 130/62 (BP Location: Right Arm, Patient Position: Sitting, Cuff Size: Large)   Pulse 94   Ht 5' 11 (1.803 m)   Wt 196 lb 3.2 oz (89 kg)   BMI 27.36 kg/m   Wt Readings from Last 3 Encounters:  06/29/24 196 lb  3.2 oz (89 kg)  04/18/24 197 lb 12.8 oz (89.7 kg)  12/11/22 202 lb (91.6 kg)     BP Readings from Last 3 Encounters:  06/29/24 130/62  04/18/24 (!) 118/52  12/11/22 (!) 100/50     Physical Exam- Limited  Constitutional:  Body mass index is 27.36 kg/m. , not in acute distress, normal state of mind Eyes:  EOMI, no exophthalmos Neck: Supple Cardiovascular: RRR, no murmurs, rubs, or gallops, no edema Respiratory: Adequate breathing efforts, no crackles, rales, rhonchi, or wheezing, diminished sounds in bases- on chronic oxygen  Musculoskeletal: no gross deformities, strength intact in all four extremities, no gross restriction of joint movements Skin:  no rashes, no hyperemia, + nicotinic discoloration to fingertips Neurological: no tremor with outstretched hands   Diabetic Foot Exam - Simple   No data filed      CMP ( most recent) CMP     Component Value Date/Time   NA 136 04/05/2017 1824   K 4.1 04/05/2017 1824   CL 100 (L) 04/05/2017 1824   CO2 29 04/05/2017 1824   GLUCOSE 349 (H) 04/05/2017 1824   BUN 20 06/14/2024 0000   CREATININE 1.1 06/14/2024 0000   CREATININE 1.03 04/05/2017 1824   CREATININE 0.87 09/29/2013 0804   CALCIUM 9.5 04/05/2017 1824   PROT 6.2 09/29/2013 0804   ALBUMIN 3.9 09/29/2013 0804   AST 19 09/29/2013 0804   ALT 19 09/29/2013 0804   ALKPHOS 79 09/29/2013 0804   BILITOT 0.3 09/29/2013 0804   EGFR 74 06/14/2024 0000   GFRNONAA >60 04/05/2017 1824     Diabetic Labs (most recent): Lab Results  Component Value Date   HGBA1C 8.5 (A) 06/29/2024   HGBA1C 11.2 06/29/2023     Lipid Panel ( most recent) Lipid Panel     Component Value Date/Time   CHOL 111 09/29/2013 0804   TRIG 148 06/14/2024 0000   HDL 32 (L) 09/29/2013 0804   CHOLHDL 3.5 09/29/2013 0804   VLDL 19 09/29/2013 0804  LDLCALC 47 06/14/2024 0000      Lab Results  Component Value Date   TSH 4.01 06/14/2024   TSH 3.061 09/29/2013           Assessment & Plan:    1) Type 2 diabetes mellitus with stage 2 chronic kidney disease, with long-term current use of insulin  (HCC) (Primary)  He presents today, accompanied by his wife, for his consultation with his CGM showing slightly above target glycemic profile.  His POCT A1c today is 8.5%.  He drinks black coffee, SF sprite, water, and sometime sweet tea.  He eats 3 meals per day and snacks between.  He does not engage in routine physical activity due to breathing problems.  He is UTD on eye exam, has never seen podiatry in the past.  Analysis of his CGM shows TIR 50%, TAR 0%, TBR 0% with a GMi of 7.7%.  - Warren Lee has currently uncontrolled symptomatic type 2 DM since 73 years of age, with most recent A1c of 8.5 %.   -Recent labs reviewed.  - I had a long discussion with him about the progressive nature of diabetes and the pathology behind its complications. -his diabetes is complicated by CAD, CHF, CKD stage 2, retinopathy and he remains at a high risk for more acute and chronic complications which include CAD, CVA, CKD, retinopathy, and neuropathy. These are all discussed in detail with him.  The following Lifestyle Medicine recommendations according to American College of Lifestyle Medicine [facility]) were discussed and offered to patient and he agrees to start the journey:  A. Whole Foods, Plant-based plate comprising of fruits and vegetables, plant-based proteins, whole-grain carbohydrates was discussed in detail with the patient.   A list for source of those nutrients were also provided to the patient.  Patient will use only water or unsweetened tea for hydration. B.  The need to stay away from risky substances including alcohol , smoking; obtaining 7 to 9 hours of restorative sleep, at least 150 minutes of moderate intensity exercise weekly, the importance of healthy social connections,  and stress reduction techniques were discussed. C.  A full color page of  Calorie density of various food groups per  pound showing examples of each food groups was provided to the patient.  - I have counseled him on diet and weight management by adopting a carbohydrate restricted/protein rich diet. Patient is encouraged to switch to unprocessed or minimally processed complex starch and increased protein intake (animal or plant source), fruits, and vegetables. -  he is advised to stick to a routine mealtimes to eat 3 meals a day and avoid unnecessary snacks (to snack only to correct hypoglycemia).   - he acknowledges that there is a room for improvement in his food and drink choices. - Suggestion is made for him to avoid simple carbohydrates from his diet including Cakes, Sweet Desserts, Ice Cream, Soda (diet and regular), Sweet Tea, Candies, Chips, Cookies, Store Bought Juices, Alcohol  in Excess of 1-2 drinks a day, Artificial Sweeteners, Coffee Creamer, and Sugar-free Products. This will help patient to have more stable blood glucose profile and potentially avoid unintended weight gain.  - I have approached him with the following individualized plan to manage his diabetes and patient agrees:   -He is advised to adjust his Tresiba to 40 units SQ nightly (not splitting doses BID).    -he is encouraged to start/continue monitoring glucose 4 times daily, before meals and before bed, to log their readings on the clinic sheets provided,  and bring them to review at follow up appointment in 3 months.  - he is warned not to take insulin  without proper monitoring per orders. - Adjustment parameters are given to him for hypo and hyperglycemia in writing. - he is encouraged to call clinic for blood glucose levels less than 70 or above 300 mg /dl. - he is advised to continue Metformin 850 mg po twice daily, therapeutically suitable for patient . - his Amaryl will be discontinued, risk outweighs benefit for this patient- due to risk of hypoglycemia.  - he is not an ideal candidate for incretin therapy given smoking  history.  - Specific targets for  A1c; LDL, HDL, and Triglycerides were discussed with the patient.  2) Blood Pressure /Hypertension:  his blood pressure is controlled to target.   he is advised to continue his current medications as prescribed by his PCP.  3) Lipids/Hyperlipidemia:    Review of his recent lipid panel from 06/14/24 showed controlled LDL at 47 .  he is advised to continue Crestor 10 mg daily at bedtime.  Side effects and precautions discussed with him.  4)  Weight/Diet:  his Body mass index is 27.36 kg/m.  -   he is NOT a candidate for major weight loss.  Exercise, and detailed carbohydrates information provided  -  detailed on discharge instructions.  5) Chronic Care/Health Maintenance: -he is not on ACEI/ARB and is on Statin medications and is encouraged to initiate and continue to follow up with Ophthalmology, Dentist, Podiatrist at least yearly or according to recommendations, and advised to QUIT SMOKING. I have recommended yearly flu vaccine and pneumonia vaccine at least every 5 years; moderate intensity exercise for up to 150 minutes weekly; and sleep for at least 7 hours a day.  - he is advised to maintain close follow up with Maree Isles, MD for primary care needs, as well as his other providers for optimal and coordinated care.   - Time spent in this patient care: 60 min, which was spent in counseling him about his diabetes and the rest reviewing his blood glucose logs, discussing his hypoglycemia and hyperglycemia episodes, reviewing his current and previous labs/studies (including abstraction from other facilities) and medications doses and developing a long term treatment plan based on the latest standards of care/guidelines; and documenting his care.    Please refer to Patient Instructions for Blood Glucose Monitoring and Insulin /Medications Dosing Guide in media tab for additional information. Please also refer to Patient Self Inventory in the Media tab for  reviewed elements of pertinent patient history.  Nancyann Roses participated in the discussions, expressed understanding, and voiced agreement with the above plans.  All questions were answered to his satisfaction. he is encouraged to contact clinic should he have any questions or concerns prior to his return visit.     Follow up plan: - Return in about 3 months (around 09/29/2024) for Diabetes F/U with A1c in office, No previsit labs, Bring meter and logs.    Benton Rio, [facility] [address] [place], [place] [address] Phone: [phone] Fax: [phone]  06/29/2024, 4:22 PM

## 2024-07-13 DIAGNOSIS — Z7189 Other specified counseling: Secondary | ICD-10-CM | POA: Diagnosis not present

## 2024-07-13 DIAGNOSIS — I10 Essential (primary) hypertension: Secondary | ICD-10-CM | POA: Diagnosis not present

## 2024-07-13 DIAGNOSIS — Z0000 Encounter for general adult medical examination without abnormal findings: Secondary | ICD-10-CM | POA: Diagnosis not present

## 2024-07-13 DIAGNOSIS — I5032 Chronic diastolic (congestive) heart failure: Secondary | ICD-10-CM | POA: Diagnosis not present

## 2024-07-13 DIAGNOSIS — Z299 Encounter for prophylactic measures, unspecified: Secondary | ICD-10-CM | POA: Diagnosis not present

## 2024-07-13 DIAGNOSIS — Z1389 Encounter for screening for other disorder: Secondary | ICD-10-CM | POA: Diagnosis not present

## 2024-07-13 DIAGNOSIS — J449 Chronic obstructive pulmonary disease, unspecified: Secondary | ICD-10-CM | POA: Diagnosis not present

## 2024-07-24 ENCOUNTER — Telehealth: Payer: Self-pay

## 2024-07-24 NOTE — Telephone Encounter (Signed)
 Tried to return pt call, left a message requesting pt return call to the office.

## 2024-08-17 ENCOUNTER — Telehealth: Payer: Self-pay | Admitting: Nurse Practitioner

## 2024-08-17 NOTE — Telephone Encounter (Signed)
 Pt states that blood sugars are running in 200s in the morning and then staying in the 200s and then 300s before bed.  Stated he would try to figure out how to go back and read the numbers.  Wanted a call back on what adjustments might need to be made.  Told pt nurse would need to call to get exact numbers

## 2024-08-17 NOTE — Telephone Encounter (Signed)
 See [employer].  We need to reach out for exact readings and what he is taking so we can adjust if needed.

## 2024-08-22 NOTE — Telephone Encounter (Signed)
 Please advise pt has called back

## 2024-09-05 NOTE — Telephone Encounter (Signed)
 I think he has trouble going back to see what his sugars.  He may need to stop by and bring his meter so I can look at it and determine if we need to adjust his medications.

## 2024-09-05 NOTE — Telephone Encounter (Signed)
 Talked with the patient. He is currently taking Tresiba  60 units at bedtime, he increased himself. Metformin  850 mg twice a day. Patient denies taking any antibiotics, Prednisone. Rotates his injection sites and he has not seen any insulin  leaking from the injection site.

## 2024-09-06 NOTE — Telephone Encounter (Signed)
 Patient was called and another voicemail was left asking that he call the office for results.

## 2024-09-06 NOTE — Telephone Encounter (Signed)
 Patient was called and made aware. He also shared that about 3 times a week he will drinks a Chocolate Protein Shake. He would like to know if this is a good think to do or not?

## 2024-09-06 NOTE — Telephone Encounter (Signed)
 Patient called and ask to call office back for results.

## 2024-09-07 NOTE — Telephone Encounter (Signed)
 Patient was called and made aware.

## 2024-09-07 NOTE — Telephone Encounter (Signed)
 No, probably not as they tend to have extra sugar too.  It is ok to use one of those shakes as a meal replacement, but not drink it in between meals.

## 2024-09-13 DIAGNOSIS — R3912 Poor urinary stream: Secondary | ICD-10-CM | POA: Diagnosis not present

## 2024-10-10 ENCOUNTER — Ambulatory Visit: Admitting: Nurse Practitioner

## 2024-10-10 ENCOUNTER — Encounter: Payer: Self-pay | Admitting: Nurse Practitioner

## 2024-10-10 VITALS — BP 134/62 | HR 78 | Ht 71.0 in | Wt 194.6 lb

## 2024-10-10 DIAGNOSIS — E1122 Type 2 diabetes mellitus with diabetic chronic kidney disease: Secondary | ICD-10-CM

## 2024-10-10 DIAGNOSIS — Z794 Long term (current) use of insulin: Secondary | ICD-10-CM | POA: Diagnosis not present

## 2024-10-10 DIAGNOSIS — I10 Essential (primary) hypertension: Secondary | ICD-10-CM | POA: Diagnosis not present

## 2024-10-10 DIAGNOSIS — Z7984 Long term (current) use of oral hypoglycemic drugs: Secondary | ICD-10-CM | POA: Diagnosis not present

## 2024-10-10 DIAGNOSIS — N182 Chronic kidney disease, stage 2 (mild): Secondary | ICD-10-CM | POA: Diagnosis not present

## 2024-10-10 DIAGNOSIS — E782 Mixed hyperlipidemia: Secondary | ICD-10-CM

## 2024-10-10 LAB — POCT GLYCOSYLATED HEMOGLOBIN (HGB A1C): Hemoglobin A1C: 8.8 % — AB (ref 4.0–5.6)

## 2024-10-10 NOTE — Progress Notes (Signed)
 Endocrinology Follow Up Note       10/10/2024, 3:58 PM   Subjective:    Patient ID: Warren Lee, male    DOB: [birth year].  Warren Lee is being seen in follow up after being seen in consultation for management of currently uncontrolled symptomatic diabetes requested by  Maree Isles, MD.   Past Medical History:  Diagnosis Date   Abdominal wall fistula    Anxiety    Chronic back pain    Chronic pancreatitis [facility])    CT July 2012: Chronic calcific pancreatitis without evidence of acute   Coronary artery disease    Diabetes mellitus    Heart disease    Hypercholesterolemia    Hypertension    Pancreatitis    S/P colonoscopy 2007   Dr. Barry: normal    S/P endoscopy July 2012   normal   Shortness of breath    Systolic murmur     Past Surgical History:  Procedure Laterality Date   CARDIAC CATHETERIZATION  2003   CHEST TUBE INSERTION     CHOLECYSTECTOMY     with complications, prolonged hospitalization, with bile leak   ESOPHAGOGASTRODUODENOSCOPY  07/22/2011   Procedure: ESOPHAGOGASTRODUODENOSCOPY (EGD);  Surgeon: Lamar CHRISTELLA Hollingshead, MD;  Location: [facility];  Service: Endoscopy;  Laterality: N/A;   exploratory laparotomy X 2     after complications with chole   PTCA  1998   TRACHEOSTOMY      Social History   Socioeconomic History   Marital status: Married    Spouse name: Not on file   Number of children: Not on file   Years of education: Not on file   Highest education level: Not on file  Occupational History   Not on file  Tobacco Use   Smoking status: Former    Current packs/day: 1.50    Average packs/day: 1.5 packs/day for 40.0 years (60.0 ttl pk-yrs)    Types: Cigarettes   Smokeless tobacco: Never   Tobacco comments:    Smokes 3/4 - pack per day   Vaping Use   Vaping status: Never Used  Substance and Sexual Activity   Alcohol  use: No   Drug use: No   Sexual activity: Not  on file  Other Topics Concern   Not on file  Social History Narrative   Not on file   Social Drivers of Health   Financial Resource Strain: Low Risk  (09/01/2023)   Overall Financial Resource Strain (CARDIA)    Difficulty of Paying Living Expenses: Not very hard  Food Insecurity: No Food Insecurity (02/09/2024)   Hunger Vital Sign    Worried About Running Out of Food in the Last Year: Never true    Ran Out of Food in the Last Year: Never true  Transportation Needs: No Transportation Needs (02/09/2024)   PRAPARE - Administrator, Civil Service (Medical): No    Lack of Transportation (Non-Medical): No  Physical Activity: Insufficiently Active (07/22/2023)   [facility]   Exercise Vital Sign    On average, how many days per week do you engage in moderate to strenuous exercise (like a brisk walk)?: 7 days    On  average, how many minutes do you engage in exercise at this level?: 20 min  Stress: Not on file  Social Connections: Not on file    Family History  Problem Relation Age of Onset   Heart failure Father    Colon cancer Neg Hx     Outpatient Encounter Medications as of 10/10/2024  Medication Sig   albuterol  (PROVENTIL ) (2.5 MG/3ML) 0.083% nebulizer solution SMARTSIG:1 Vial(s) Via Nebulizer 4 Times Daily PRN   albuterol  (VENTOLIN  HFA) 108 (90 Base) MCG/ACT inhaler Inhale 2 puffs into the lungs every 4 (four) hours as needed.   alfuzosin (UROXATRAL) 10 MG 24 hr tablet Take 10 mg by mouth daily.   aspirin  EC 81 MG tablet Take 1 tablet (81 mg total) by mouth daily.   bisoprolol  (ZEBETA ) 5 MG tablet TAKE 1 TABLET (5 MG TOTAL) BY MOUTH DAILY.   finasteride (PROSCAR) 5 MG tablet Take 5 mg by mouth daily.   Fluticasone-Umeclidin-Vilant (TRELEGY ELLIPTA IN) Inhale 1 puff into the lungs daily.   furosemide  (LASIX ) 40 MG tablet TAKE 1 TABLET BY MOUTH EVERY DAY   gabapentin (NEURONTIN) 300 MG capsule Take 300 mg by mouth 2 (two) times daily.   insulin  degludec  (TRESIBA  FLEXTOUCH) 100 UNIT/ML FlexTouch Pen Inject 40 Units into the skin at bedtime.   Magnesium Oxide 420 MG TABS Take 1 tablet by mouth daily.   metFORMIN  (GLUCOPHAGE ) 850 MG tablet Take 1 tablet (850 mg total) by mouth 2 (two) times daily with a meal.   Multiple Vitamin (MULTIVITAMIN WITH MINERALS) TABS tablet Take 1 tablet by mouth daily.   nitroGLYCERIN  (NITROSTAT ) 0.4 MG SL tablet Place 1 tablet (0.4 mg total) under the tongue every 5 (five) minutes x 3 doses as needed for chest pain (if no relief after 3rd dose, proceed to ED or call 911).   PARoxetine (PAXIL) 40 MG tablet Take 40 mg by mouth daily.   rosuvastatin (CRESTOR) 10 MG tablet Take 10 mg by mouth at bedtime.   No facility-administered encounter medications on file as of 10/10/2024.    ALLERGIES: No Known Allergies  VACCINATION STATUS:  There is no immunization history on file for this patient.  Diabetes He presents for his follow-up diabetic visit. He has type 2 diabetes mellitus. Onset time: diagnosed at appros age of 83. His disease course has been fluctuating. There are no hypoglycemic associated symptoms. Associated symptoms include blurred vision, fatigue, foot paresthesias, polydipsia and polyuria. There are no hypoglycemic complications. Symptoms are stable. Diabetic complications include heart disease, nephropathy, peripheral neuropathy and retinopathy. Risk factors for coronary artery disease include diabetes mellitus, male sex, hypertension, sedentary lifestyle and tobacco exposure. Current diabetic treatment includes insulin  injections and oral agent (monotherapy). He is compliant with treatment most of the time. His weight is stable. He is following a generally unhealthy diet. When asked about meal planning, he reported none. He has not had a previous visit with a dietitian. He participates in exercise intermittently. His home blood glucose trend is fluctuating minimally. His overall blood glucose range is 180-200  mg/dl. (He presents today, accompanied by his wife, with his CGM showing at goal fasting and above target postprandial readings.  His POCT A1c today is 8.8%, increasing from last visit of 8.5%.  Analysis of his CGM shows TIR 43%, TAR 57%, TBR 0% with a GMI of 7.9%.  He does take an inhaled steroid daily for his lung condition.  He has been drinking mostly water, with exception on Sundays where he eats with his  family and will have sweet tea.  He has done well with diet, but admits he has been snacking more.  During the visit today, his portable oxygen  machine over-heated and cut off.  His O2 was monitored and dropped at one point to 79%.  We did give the machine time to cool off and it finally did start working again.  He was assisted out to the car via [facility] with instructions to go straight home where he can hook up to his O2 concentrator and call the company about the machine overheating as this was a huge safety issue.) An ACE inhibitor/angiotensin II receptor blocker is not being taken. He does not see a podiatrist.Eye exam is current.     Review of systems  Constitutional: + slowly decreasing body weight, current Body mass index is 27.14 kg/m., no fatigue, no subjective hyperthermia, no subjective hypothermia Eyes: no blurry vision, no xerophthalmia ENT: no sore throat, no nodules palpated in throat, no dysphagia/odynophagia, no hoarseness Cardiovascular: no chest pain, no shortness of breath, no palpitations, no leg swelling Respiratory: no cough, + shortness of breath- on chronic O2 Gastrointestinal: no nausea/vomiting/diarrhea Musculoskeletal: no muscle/joint aches Skin: no rashes, no hyperemia Neurological: no tremors, no numbness, no tingling, no dizziness Psychiatric: no depression, no anxiety  Objective:     BP 134/62 (BP Location: Left Arm, Patient Position: Sitting, Cuff Size: Large)   Pulse 78   Ht 5' 11 (1.803 m)   Wt 194 lb 9.6 oz (88.3 kg)   BMI 27.14 kg/m   Wt Readings  from Last 3 Encounters:  10/10/24 194 lb 9.6 oz (88.3 kg)  06/29/24 196 lb 3.2 oz (89 kg)  04/18/24 197 lb 12.8 oz (89.7 kg)     BP Readings from Last 3 Encounters:  10/10/24 134/62  06/29/24 130/62  04/18/24 (!) 118/52     Physical Exam- Limited  Constitutional:  Body mass index is 27.14 kg/m. , not in acute distress, normal state of mind Eyes:  EOMI, no exophthalmos Musculoskeletal: no gross deformities, strength intact in all four extremities, no gross restriction of joint movements Skin:  no rashes, no hyperemia, + nicotinic discoloration to fingertips Neurological: no tremor with outstretched hands   Diabetic Foot Exam - Simple   No data filed      CMP ( most recent) CMP     Component Value Date/Time   NA 136 04/05/2017 1824   K 4.1 04/05/2017 1824   CL 100 (L) 04/05/2017 1824   CO2 29 04/05/2017 1824   GLUCOSE 349 (H) 04/05/2017 1824   BUN 20 06/14/2024 0000   CREATININE 1.1 06/14/2024 0000   CREATININE 1.03 04/05/2017 1824   CREATININE 0.87 09/29/2013 0804   CALCIUM 9.5 04/05/2017 1824   PROT 6.2 09/29/2013 0804   ALBUMIN 3.9 09/29/2013 0804   AST 19 09/29/2013 0804   ALT 19 09/29/2013 0804   ALKPHOS 79 09/29/2013 0804   BILITOT 0.3 09/29/2013 0804   EGFR 74 06/14/2024 0000   GFRNONAA >60 04/05/2017 1824     Diabetic Labs (most recent): Lab Results  Component Value Date   HGBA1C 8.8 (A) 10/10/2024   HGBA1C 8.5 (A) 06/29/2024   HGBA1C 11.2 06/29/2023     Lipid Panel ( most recent) Lipid Panel     Component Value Date/Time   CHOL 111 09/29/2013 0804   TRIG 148 06/14/2024 0000   HDL 32 (L) 09/29/2013 0804   CHOLHDL 3.5 09/29/2013 0804   VLDL 19 09/29/2013 0804   LDLCALC 47  06/14/2024 0000      Lab Results  Component Value Date   TSH 4.01 06/14/2024   TSH 3.061 09/29/2013           Assessment & Plan:   1) Type 2 diabetes mellitus with stage 2 chronic kidney disease, with long-term current use of insulin  (HCC) (Primary)  He  presents today, accompanied by his wife, with his CGM showing at goal fasting and above target postprandial readings.  His POCT A1c today is 8.8%, increasing from last visit of 8.5%.  Analysis of his CGM shows TIR 43%, TAR 57%, TBR 0% with a GMI of 7.9%.  He does take an inhaled steroid daily for his lung condition.  He has been drinking mostly water, with exception on Sundays where he eats with his family and will have sweet tea.  He has done well with diet, but admits he has been snacking more.    -During the visit today, his portable oxygen  machine over-heated and cut off.  His O2 was monitored and dropped at one point to 79%.  We did give the machine time to cool off and it finally did start working again.  He was assisted out to the car via [facility] with instructions to go straight home where he can hook up to his O2 concentrator and call the company about the machine overheating as this was a huge safety issue.  - Warren Lee has currently uncontrolled symptomatic type 2 DM since 73 years of age, with most recent A1c of 8.5 %.   -Recent labs reviewed.  - I had a long discussion with him about the progressive nature of diabetes and the pathology behind its complications. -his diabetes is complicated by CAD, CHF, CKD stage 2, retinopathy and he remains at a high risk for more acute and chronic complications which include CAD, CVA, CKD, retinopathy, and neuropathy. These are all discussed in detail with him.  The following Lifestyle Medicine recommendations according to American College of Lifestyle Medicine [facility]) were discussed and offered to patient and he agrees to start the journey:  A. Whole Foods, Plant-based plate comprising of fruits and vegetables, plant-based proteins, whole-grain carbohydrates was discussed in detail with the patient.   A list for source of those nutrients were also provided to the patient.  Patient will use only water or unsweetened tea for hydration. B.  The need to stay  away from risky substances including alcohol , smoking; obtaining 7 to 9 hours of restorative sleep, at least 150 minutes of moderate intensity exercise weekly, the importance of healthy social connections,  and stress reduction techniques were discussed. C.  A full color page of  Calorie density of various food groups per pound showing examples of each food groups was provided to the patient.  - Nutritional counseling repeated/built upon at each appointment.  - The patient admits there is a room for improvement in their diet and drink choices. -  Suggestion is made for the patient to avoid simple carbohydrates from their diet including Cakes, Sweet Desserts / Pastries, Ice Cream, Soda (diet and regular), Sweet Tea, Candies, Chips, Cookies, Sweet Pastries, Store Bought Juices, Alcohol  in Excess of 1-2 drinks a day, Artificial Sweeteners, Coffee Creamer, and Sugar-free Products. This will help patient to have stable blood glucose profile and potentially avoid unintended weight gain.   - I encouraged the patient to switch to unprocessed or minimally processed complex starch and increased protein intake (animal or plant source), fruits, and vegetables.   - Patient  is advised to stick to a routine mealtimes to eat 3 meals a day and avoid unnecessary snacks (to snack only to correct hypoglycemia).  - I have approached him with the following individualized plan to manage his diabetes and patient agrees:   -He is advised to continue Tresiba  70 units SQ nightly and Metformin  850 mg po twice daily.  We did talk about dietary changes to avoid escalation of treatment.    -he is encouraged to start/continue monitoring glucose 4 times daily, before meals and before bed, and to call the clinic if he has readings less than 70 or above 300 for 3 tests in a row.  - he is warned not to take insulin  without proper monitoring per orders. - Adjustment parameters are given to him for hypo and hyperglycemia in  writing. - his Amaryl will be discontinued, risk outweighs benefit for this patient- due to risk of hypoglycemia.  - he is not an ideal candidate for incretin therapy given smoking history.  - Specific targets for  A1c; LDL, HDL, and Triglycerides were discussed with the patient.  2) Blood Pressure /Hypertension:  his blood pressure is controlled to target.   he is advised to continue his current medications as prescribed by his PCP.  3) Lipids/Hyperlipidemia:    Review of his recent lipid panel from 06/14/24 showed controlled LDL at 47 .  he is advised to continue Crestor 10 mg daily at bedtime.  Side effects and precautions discussed with him.  4)  Weight/Diet:  his Body mass index is 27.14 kg/m.  -   he is NOT a candidate for major weight loss.  Exercise, and detailed carbohydrates information provided  -  detailed on discharge instructions.  5) Chronic Care/Health Maintenance: -he is not on ACEI/ARB and is on Statin medications and is encouraged to initiate and continue to follow up with Ophthalmology, Dentist, Podiatrist at least yearly or according to recommendations, and advised to QUIT SMOKING. I have recommended yearly flu vaccine and pneumonia vaccine at least every 5 years; moderate intensity exercise for up to 150 minutes weekly; and sleep for at least 7 hours a day.  - he is advised to maintain close follow up with Maree Isles, MD for primary care needs, as well as his other providers for optimal and coordinated care.     I spent  55  minutes in the care of the patient today including review of labs from CMP, Lipids, Thyroid  Function, Hematology (current and previous including abstractions from other facilities); face-to-face time discussing  his blood glucose readings/logs, discussing hypoglycemia and hyperglycemia episodes and symptoms, medications doses, his options of short and long term treatment based on the latest standards of care / guidelines;  discussion about  incorporating lifestyle medicine;  and documenting the encounter. Risk reduction counseling performed per USPSTF guidelines to reduce obesity and cardiovascular risk factors.     Please refer to Patient Instructions for Blood Glucose Monitoring and Insulin /Medications Dosing Guide  in media tab for additional information. Please  also refer to  Patient Self Inventory in the Media  tab for reviewed elements of pertinent patient history.  Nancyann Roses participated in the discussions, expressed understanding, and voiced agreement with the above plans.  All questions were answered to his satisfaction. he is encouraged to contact clinic should he have any questions or concerns prior to his return visit.     Follow up plan: - Return in about 3 months (around 01/10/2025) for Diabetes F/U with A1c in office,  No previsit labs, Bring meter and logs.   Benton Rio, [facility] [address] [place], [place] [address] Phone: [phone] Fax: [phone]  10/10/2024, 3:58 PM

## 2025-01-17 ENCOUNTER — Ambulatory Visit: Admitting: Nurse Practitioner

## 2025-01-17 ENCOUNTER — Encounter: Payer: Self-pay | Admitting: Nurse Practitioner

## 2025-01-17 VITALS — BP 110/60 | HR 128 | Ht 71.0 in | Wt 196.0 lb

## 2025-01-17 DIAGNOSIS — Z7984 Long term (current) use of oral hypoglycemic drugs: Secondary | ICD-10-CM | POA: Diagnosis not present

## 2025-01-17 DIAGNOSIS — I10 Essential (primary) hypertension: Secondary | ICD-10-CM

## 2025-01-17 DIAGNOSIS — Z794 Long term (current) use of insulin: Secondary | ICD-10-CM

## 2025-01-17 DIAGNOSIS — E782 Mixed hyperlipidemia: Secondary | ICD-10-CM | POA: Diagnosis not present

## 2025-01-17 DIAGNOSIS — N182 Chronic kidney disease, stage 2 (mild): Secondary | ICD-10-CM | POA: Diagnosis not present

## 2025-01-17 DIAGNOSIS — E1122 Type 2 diabetes mellitus with diabetic chronic kidney disease: Secondary | ICD-10-CM

## 2025-01-17 MED ORDER — METFORMIN HCL 850 MG PO TABS: 850.0000 mg | ORAL_TABLET | Freq: Two times a day (BID) | ORAL | 3 refills | Status: AC

## 2025-01-17 MED ORDER — TRESIBA FLEXTOUCH 100 UNIT/ML ~~LOC~~ SOPN: 55.0000 [IU] | PEN_INJECTOR | Freq: Every day | SUBCUTANEOUS | 3 refills | Status: AC

## 2025-01-17 NOTE — Progress Notes (Signed)
 "                                                                        Endocrinology Follow Up Note       01/17/2025, 3:05 PM   Subjective:    Patient ID: Warren Lee, male    DOB: March 13, 1951.  Warren Lee is being seen in follow up after being seen in consultation for management of currently uncontrolled symptomatic diabetes requested by  Maree Isles, MD.   Past Medical History:  Diagnosis Date   Abdominal wall fistula    Anxiety    Chronic back pain    Chronic pancreatitis [facility])    CT July 2012: Chronic calcific pancreatitis without evidence of acute   Coronary artery disease    Diabetes mellitus    Heart disease    Hypercholesterolemia    Hypertension    Pancreatitis    S/P colonoscopy 2007   Dr. Barry: normal    S/P endoscopy July 2012   normal   Shortness of breath    Systolic murmur     Past Surgical History:  Procedure Laterality Date   CARDIAC CATHETERIZATION  2003   CHEST TUBE INSERTION     CHOLECYSTECTOMY     with complications, prolonged hospitalization, with bile leak   ESOPHAGOGASTRODUODENOSCOPY  07/22/2011   Procedure: ESOPHAGOGASTRODUODENOSCOPY (EGD);  Surgeon: Lamar CHRISTELLA Hollingshead, MD;  Location: [facility];  Service: Endoscopy;  Laterality: N/A;   exploratory laparotomy X 2     after complications with chole   PTCA  1998   TRACHEOSTOMY      Social History   Socioeconomic History   Marital status: Married    Spouse name: Not on file   Number of children: Not on file   Years of education: Not on file   Highest education level: Not on file  Occupational History   Not on file  Tobacco Use   Smoking status: Former    Current packs/day: 1.50    Average packs/day: 1.5 packs/day for 40.0 years (60.0 ttl pk-yrs)    Types: Cigarettes   Smokeless tobacco: Never   Tobacco comments:    Smokes 3/4 - pack per day   Vaping Use   Vaping status: Never Used  Substance and Sexual Activity   Alcohol  use: No   Drug use: No   Sexual activity: Not  on file  Other Topics Concern   Not on file  Social History Narrative   Not on file   Social Drivers of Health   Tobacco Use: Medium Risk (01/17/2025)   Patient History    Smoking Tobacco Use: Former    Smokeless Tobacco Use: Never    Passive Exposure: Not on file  Financial Resource Strain: Low Risk (12/05/2024)   Received from [facility]   Overall Financial Resource Strain (CARDIA)    How hard is it for you to pay for the very basics like food, housing, medical care, and heating?: Not hard at all  Food Insecurity: No Food Insecurity (12/05/2024)   Received from [facility]   Epic    Within the past 12 months, you worried that your food would run out before you got the money to buy more.: Never  true    Within the past 12 months, the food you bought just didn't last and you didn't have money to get more.: Never true  Transportation Needs: No Transportation Needs (12/05/2024)   Received from [facility] - Transportation    Lack of Transportation (Medical): No    Lack of Transportation (Non-Medical): No  Physical Activity: Inactive (12/05/2024)   Received from [facility]   Exercise Vital Sign    On average, how many days per week do you engage in moderate to strenuous exercise (like a brisk walk)?: 0 days    On average, how many minutes do you engage in exercise at this level?: 0 min  Stress: No Stress Concern Present (12/05/2024)   Received from [facility] - Occupational Stress Questionnaire    Do you feel stress - tense, restless, nervous, or anxious, or unable to sleep at night because your mind is troubled all the time - these days?: Not at all  Social Connections: Moderately Isolated (12/05/2024)   Received from [facility]   Social Connection and Isolation Panel    Frequency of Communication with Friends and Family: Three times a week    Frequency of Social Gatherings with Friends and Family: Three  times a week    Attends Religious Services: Never    Active Member of Clubs or Organizations: No    Attends Banker Meetings: Never    Are you married, widowed, divorced, separated, never married, or living with a partner?: Married  Depression (PHQ2-9): Not on file  Alcohol  Screen: Not on file  Housing: Low Risk (11/09/2023)   Housing    Last Housing Risk Score: 0  Utilities: Low Risk (12/05/2024)   Received from [facility]   Utilities    Within the past 12 months, have you been unable to get utilities(heat, electricity) when it was really needed?: No  Health Literacy: Low Risk (12/05/2024)   Received from [facility] Literacy    How often do you need to have someone help you when you read instructions, pamphlets, or other written material from your doctor or pharmacy?: Never    Family History  Problem Relation Age of Onset   Heart failure Father    Colon cancer Neg Hx     Outpatient Encounter Medications as of 01/17/2025  Medication Sig   albuterol  (PROVENTIL ) (2.5 MG/3ML) 0.083% nebulizer solution SMARTSIG:1 Vial(s) Via Nebulizer 4 Times Daily PRN   albuterol  (VENTOLIN  HFA) 108 (90 Base) MCG/ACT inhaler Inhale 2 puffs into the lungs every 4 (four) hours as needed.   alfuzosin (UROXATRAL) 10 MG 24 hr tablet Take 10 mg by mouth daily.   amLODipine (NORVASC) 5 MG tablet Take 5 mg by mouth daily.   aspirin  EC 81 MG tablet Take 1 tablet (81 mg total) by mouth daily.   bisoprolol  (ZEBETA ) 5 MG tablet TAKE 1 TABLET (5 MG TOTAL) BY MOUTH DAILY.   finasteride (PROSCAR) 5 MG tablet Take 5 mg by mouth daily.   Fluticasone-Umeclidin-Vilant (TRELEGY ELLIPTA IN) Inhale 1 puff into the lungs daily.   furosemide  (LASIX ) 40 MG tablet TAKE 1 TABLET BY MOUTH EVERY DAY   gabapentin (NEURONTIN) 300 MG capsule Take 300 mg by mouth 2 (two) times daily.   LORazepam (ATIVAN) 1 MG tablet Take 1 mg by mouth every 8 (eight) hours as needed.   losartan  (COZAAR ) 50 MG tablet  Take 50  mg by mouth daily.   Magnesium Oxide 420 MG TABS Take 1 tablet by mouth daily.   Multiple Vitamin (MULTIVITAMIN WITH MINERALS) TABS tablet Take 1 tablet by mouth daily.   nitroGLYCERIN  (NITROSTAT ) 0.4 MG SL tablet Place 1 tablet (0.4 mg total) under the tongue every 5 (five) minutes x 3 doses as needed for chest pain (if no relief after 3rd dose, proceed to ED or call 911).   PARoxetine (PAXIL) 40 MG tablet Take 40 mg by mouth daily.   rosuvastatin (CRESTOR) 10 MG tablet Take 10 mg by mouth at bedtime.   [DISCONTINUED] insulin  degludec (TRESIBA  FLEXTOUCH) 100 UNIT/ML FlexTouch Pen Inject 40 Units into the skin at bedtime. (Patient taking differently: Inject 70 Units into the skin at bedtime.)   [DISCONTINUED] metFORMIN  (GLUCOPHAGE ) 850 MG tablet Take 1 tablet (850 mg total) by mouth 2 (two) times daily with a meal.   insulin  degludec (TRESIBA  FLEXTOUCH) 100 UNIT/ML FlexTouch Pen Inject 55 Units into the skin at bedtime.   metFORMIN  (GLUCOPHAGE ) 850 MG tablet Take 1 tablet (850 mg total) by mouth 2 (two) times daily with a meal.   No facility-administered encounter medications on file as of 01/17/2025.    ALLERGIES: No Known Allergies  VACCINATION STATUS:  There is no immunization history on file for this patient.  Diabetes He presents for his follow-up diabetic visit. He has type 2 diabetes mellitus. Onset time: diagnosed at appros age of 39. His disease course has been fluctuating. There are no hypoglycemic associated symptoms. Associated symptoms include blurred vision, fatigue, foot paresthesias, polydipsia and polyuria. There are no hypoglycemic complications. Symptoms are stable. Diabetic complications include heart disease, nephropathy, peripheral neuropathy and retinopathy. Risk factors for coronary artery disease include diabetes mellitus, male sex, hypertension, sedentary lifestyle and tobacco exposure. Current diabetic treatment includes insulin  injections and oral agent  (monotherapy). He is compliant with treatment most of the time. His weight is stable. He is following a generally unhealthy diet. When asked about meal planning, he reported none. He has not had a previous visit with a dietitian. He participates in exercise intermittently. His home blood glucose trend is fluctuating dramatically. His overall blood glucose range is 180-200 mg/dl. (He presents today, accompanied by his wife, with his CGM showing tight fasting and above target postprandial readings.  His most recent A1c on 12/9 was 7.1%, improving from last visit of 8.8%.  Analysis of his CGM shows TIR 43%, TAR 55%, TBR 3% with a GMI of 8.1%.  He has been in the hospital several times since last visit.  Now in [facility] for debility and ongoing lung problems.  He is now on palliative care.  He has been on steroids on and off since last visit.  He admits he has slipped back into eating/drinking sweets but promises to do better.) An ACE inhibitor/angiotensin II receptor blocker is not being taken. He does not see a podiatrist.Eye exam is current.     Review of systems  Constitutional: + decreasing body weight, current Body mass index is 27.34 kg/m., no fatigue, no subjective hyperthermia, no subjective hypothermia Eyes: no blurry vision, no xerophthalmia ENT: no sore throat, no nodules palpated in throat, no dysphagia/odynophagia, no hoarseness Cardiovascular: no chest pain, no shortness of breath, no palpitations, no leg swelling Respiratory: no cough, + shortness of breath- on chronic O2 Gastrointestinal: no nausea/vomiting/diarrhea, in WC due to deconditioning Musculoskeletal: no muscle/joint aches Skin: no rashes, no hyperemia Neurological: no tremors, no numbness, no tingling, no dizziness Psychiatric: no depression,  no anxiety  Objective:     BP 110/60 (BP Location: Left Arm, Patient Position: Sitting, Cuff Size: Large)   Pulse (!) 128 Comment: Retake Radial Pulse - patient on O2  Ht 5' 11 (1.803  m)   Wt 196 lb (88.9 kg) Comment: patient in wheelchair, last weight at the hospital was as recorded  BMI 27.34 kg/m   Wt Readings from Last 3 Encounters:  01/17/25 196 lb (88.9 kg)  10/10/24 194 lb 9.6 oz (88.3 kg)  06/29/24 196 lb 3.2 oz (89 kg)     BP Readings from Last 3 Encounters:  01/17/25 110/60  10/10/24 134/62  06/29/24 130/62     Physical Exam- Limited  Constitutional:  Body mass index is 27.34 kg/m. , not in acute distress, normal state of mind Eyes:  EOMI, no exophthalmos Musculoskeletal: no gross deformities, strength intact in all four extremities, no gross restriction of joint movements, in WC due to deconditioning Skin:  no rashes, no hyperemia, + nicotinic discoloration to fingertips Neurological: no tremor with outstretched hands   Diabetic Foot Exam - Simple   No data filed      CMP ( most recent) CMP     Component Value Date/Time   NA 136 04/05/2017 1824   K 4.1 04/05/2017 1824   CL 100 (L) 04/05/2017 1824   CO2 29 04/05/2017 1824   GLUCOSE 349 (H) 04/05/2017 1824   BUN 20 06/14/2024 0000   CREATININE 1.1 06/14/2024 0000   CREATININE 1.03 04/05/2017 1824   CREATININE 0.87 09/29/2013 0804   CALCIUM 9.5 04/05/2017 1824   PROT 6.2 09/29/2013 0804   ALBUMIN 3.9 09/29/2013 0804   AST 19 09/29/2013 0804   ALT 19 09/29/2013 0804   ALKPHOS 79 09/29/2013 0804   BILITOT 0.3 09/29/2013 0804   EGFR 74 06/14/2024 0000   GFRNONAA >60 04/05/2017 1824     Diabetic Labs (most recent): Lab Results  Component Value Date   HGBA1C 8.8 (A) 10/10/2024   HGBA1C 8.5 (A) 06/29/2024   HGBA1C 11.2 06/29/2023     Lipid Panel ( most recent) Lipid Panel     Component Value Date/Time   CHOL 111 09/29/2013 0804   TRIG 148 06/14/2024 0000   HDL 32 (L) 09/29/2013 0804   CHOLHDL 3.5 09/29/2013 0804   VLDL 19 09/29/2013 0804   LDLCALC 47 06/14/2024 0000      Lab Results  Component Value Date   TSH 4.01 06/14/2024   TSH 3.061 09/29/2013            Assessment & Plan:   1) Type 2 diabetes mellitus with stage 2 chronic kidney disease, with long-term current use of insulin  (HCC) (Primary)  He presents today, accompanied by his wife, with his CGM showing tight fasting and above target postprandial readings.  His most recent A1c on 12/9 was 7.1%, improving from last visit of 8.8%.  Analysis of his CGM shows TIR 43%, TAR 55%, TBR 3% with a GMI of 8.1%.  He has been in the hospital several times since last visit.  Now in [facility] for debility and ongoing lung problems.  He is now on palliative care.  He has been on steroids on and off since last visit.  He admits he has slipped back into eating/drinking sweets but promises to do better.  - Warren Lee has currently uncontrolled symptomatic type 2 DM since 74 years of age.   -Recent labs reviewed.  - I had a long discussion with him about the  progressive nature of diabetes and the pathology behind its complications. -his diabetes is complicated by CAD, CHF, CKD stage 2, retinopathy and he remains at a high risk for more acute and chronic complications which include CAD, CVA, CKD, retinopathy, and neuropathy. These are all discussed in detail with him.  The following Lifestyle Medicine recommendations according to American College of Lifestyle Medicine [facility]) were discussed and offered to patient and he agrees to start the journey:  A. Whole Foods, Plant-based plate comprising of fruits and vegetables, plant-based proteins, whole-grain carbohydrates was discussed in detail with the patient.   A list for source of those nutrients were also provided to the patient.  Patient will use only water or unsweetened tea for hydration. B.  The need to stay away from risky substances including alcohol , smoking; obtaining 7 to 9 hours of restorative sleep, at least 150 minutes of moderate intensity exercise weekly, the importance of healthy social connections,  and stress reduction techniques were discussed. C.   A full color page of  Calorie density of various food groups per pound showing examples of each food groups was provided to the patient.  - Nutritional counseling repeated/built upon at each appointment.  - The patient admits there is a room for improvement in their diet and drink choices. -  Suggestion is made for the patient to avoid simple carbohydrates from their diet including Cakes, Sweet Desserts / Pastries, Ice Cream, Soda (diet and regular), Sweet Tea, Candies, Chips, Cookies, Sweet Pastries, Store Bought Juices, Alcohol  in Excess of 1-2 drinks a day, Artificial Sweeteners, Coffee Creamer, and Sugar-free Products. This will help patient to have stable blood glucose profile and potentially avoid unintended weight gain.   - I encouraged the patient to switch to unprocessed or minimally processed complex starch and increased protein intake (animal or plant source), fruits, and vegetables.   - Patient is advised to stick to a routine mealtimes to eat 3 meals a day and avoid unnecessary snacks (to snack only to correct hypoglycemia).  - I have approached him with the following individualized plan to manage his diabetes and patient agrees:   -He is advised to lower Tresiba  to 55 units SQ nightly (to help avoid fasting hypoglycemia) and continue Metformin  850 mg po twice daily.  We did talk about dietary changes to avoid escalation of treatment.  We talked about potentially switching to premixed insulin  or adding prandial insulin  but wife still works and leaves him home alone, thus for safety purposes we will try to avoid that.  -he is encouraged to start/continue monitoring glucose 4 times daily, before meals and before bed, and to call the clinic if he has readings less than 70 or above 300 for 3 tests in a row.  - he is warned not to take insulin  without proper monitoring per orders. - Adjustment parameters are given to him for hypo and hyperglycemia in writing. - his Amaryl will be  discontinued, risk outweighs benefit for this patient- due to risk of hypoglycemia.  - he is not an ideal candidate for incretin therapy given smoking history.  - Specific targets for  A1c; LDL, HDL, and Triglycerides were discussed with the patient.  2) Blood Pressure /Hypertension:  his blood pressure is controlled to target.   he is advised to continue his current medications as prescribed by his PCP.  3) Lipids/Hyperlipidemia:    Review of his recent lipid panel from 06/14/24 showed controlled LDL at 47 .  he is advised to continue Crestor  10 mg daily at bedtime.  Side effects and precautions discussed with him.  4)  Weight/Diet:  his Body mass index is 27.34 kg/m.  -   he is NOT a candidate for major weight loss.  Exercise, and detailed carbohydrates information provided  -  detailed on discharge instructions.  5) Chronic Care/Health Maintenance: -he is not on ACEI/ARB and is on Statin medications and is encouraged to initiate and continue to follow up with Ophthalmology, Dentist, Podiatrist at least yearly or according to recommendations, and advised to QUIT SMOKING. I have recommended yearly flu vaccine and pneumonia vaccine at least every 5 years; moderate intensity exercise for up to 150 minutes weekly; and sleep for at least 7 hours a day.  - he is advised to maintain close follow up with Maree Isles, MD for primary care needs, as well as his other providers for optimal and coordinated care.     I spent  48  minutes in the care of the patient today including review of labs from CMP, Lipids, Thyroid  Function, Hematology (current and previous including abstractions from other facilities); face-to-face time discussing  his blood glucose readings/logs, discussing hypoglycemia and hyperglycemia episodes and symptoms, medications doses, his options of short and long term treatment based on the latest standards of care / guidelines;  discussion about incorporating lifestyle medicine;  and  documenting the encounter. Risk reduction counseling performed per USPSTF guidelines to reduce obesity and cardiovascular risk factors.     Please refer to Patient Instructions for Blood Glucose Monitoring and Insulin /Medications Dosing Guide  in media tab for additional information. Please  also refer to  Patient Self Inventory in the Media  tab for reviewed elements of pertinent patient history.  Nancyann Roses participated in the discussions, expressed understanding, and voiced agreement with the above plans.  All questions were answered to his satisfaction. he is encouraged to contact clinic should he have any questions or concerns prior to his return visit.    Follow up plan: - Return in about 3 months (around 04/17/2025) for Diabetes F/U with A1c in office, No previsit labs, Bring meter and logs.   Benton Rio, [facility] [address] [place], [place] [address] Phone: [phone] Fax: [phone]  01/17/2025, 3:05 PM    "

## 2025-04-24 ENCOUNTER — Ambulatory Visit: Admitting: Nurse Practitioner
